# Patient Record
Sex: Female | Born: 1990 | Race: White | Hispanic: No | Marital: Single | Smoking: Never smoker
Health system: Southern US, Community
[De-identification: ages and names within clinical notes are randomized; demographics above are authoritative.]

## PROBLEM LIST (undated history)

## (undated) DIAGNOSIS — L709 Acne, unspecified: Secondary | ICD-10-CM

## (undated) DIAGNOSIS — G43909 Migraine, unspecified, not intractable, without status migrainosus: Secondary | ICD-10-CM

## (undated) DIAGNOSIS — R87629 Unspecified abnormal cytological findings in specimens from vagina: Secondary | ICD-10-CM

---

## 2019-01-01 ENCOUNTER — Ambulatory Visit: Payer: Self-pay | Admitting: Family Medicine

## 2019-01-01 DIAGNOSIS — M79675 Pain in left toe(s): Secondary | ICD-10-CM

## 2019-01-01 NOTE — Patient Instructions (Signed)
1. Will assess for infection and/or gout and provide results to patients PCP for further work up (go to [facility]) 2. Treatment recommended today is to monitor and keep pain scale and diet diary and use Ibuprofen 400 mg  As needed for pain. 3. Wear comfortable shoes when at all possible  Acute Pain, Adult Acute pain is a type of pain that may last for just a few days or as long as six months. It is often related to an illness, injury, or medical procedure. Acute pain may be mild, moderate, or severe. It usually goes away once your injury has healed or you are no longer ill. Pain can make it hard for you to do daily activities. It can cause anxiety and lead to other problems if left untreated. Treatment depends on the cause and severity of your acute pain. Follow these instructions at home:  Check your pain level as told by your health care provider.  Take over-the-counter and prescription medicines only as told by your health care provider.  If you are taking prescription pain medicine: ? Ask your health care provider about taking a stool softener or laxative to prevent constipation. ? Do not stop taking the medicine suddenly. Talk to your health care provider about how and when to discontinue prescription pain medicine. ? If your pain is severe, do not take more pills than instructed by your health care provider. ? Do not take other over-the-counter pain medicines in addition to this medicine unless told by your health care provider. ? Do not drive or operate heavy machinery while taking prescription pain medicine.  Apply ice or heat as told by your health care provider. These may reduce swelling and pain.  Ask your health care provider if other strategies such as distraction, relaxation, or physical therapies can help your pain.  Keep all follow-up visits as told by your health care provider. This is important. Contact a health care provider if:  You have pain that is not controlled by  medicine.  Your pain does not improve or gets worse.  You have side effects from pain medicines, such as vomitingor confusion. Get help right away if:  You have severe pain.  You have trouble breathing.  You lose consciousness.  You have chest pain or pressure that lasts for more than a few minutes. Along with the chest pain you may: ? Have pain or discomfort in one or both arms, your back, neck, jaw, or stomach. ? Have shortness of breath. ? Break out in a cold sweat. ? Feel nauseous. ? Become light-headed. These symptoms may represent a serious problem that is an emergency. Do not wait to see if the symptoms will go away. Get medical help right away. Call your local emergency services (911 in the [place] Do not drive yourself to the hospital. This information is not intended to replace advice given to you by your health care provider. Make sure you discuss any questions you have with your health care provider. Document Released: 11/12/2015 Document Revised: 04/05/2016 Document Reviewed: 11/12/2015 Elsevier Interactive Patient Education  2019 [employer].

## 2019-01-01 NOTE — Progress Notes (Signed)
Michelle Church is a 28 y.o. female who presents today with 1 days of left great toe pain. She has taken ibuprofen 400 mg at 5 am this morning- she does report that this provided her with relief.. She denies any history of trauma but does report a recent pedicure 3 weeks ago. She reports she had 3-4 drinks of alcohol last night and having pork and mashed potatoes for dinner. She is unsure if her father and grandfather has gout but believes they may have. 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    Review of Systems  Constitutional: Negative for chills, fever and malaise/fatigue.  HENT: Negative for congestion, ear discharge, ear pain, sinus pain and sore throat.   Eyes: Negative.   Respiratory: Negative for cough, sputum production and shortness of breath.   Cardiovascular: Negative.  Negative for chest pain.  Gastrointestinal: Negative for abdominal pain, diarrhea, nausea and vomiting.  Genitourinary: Negative for dysuria, frequency, hematuria and urgency.  Musculoskeletal: Negative for myalgias.       Right great toe pain x 1 day  Skin: Negative.   Neurological: Negative for headaches.  Endo/Heme/Allergies: Negative.   Psychiatric/Behavioral: Negative.     Doucett has a current medication list which includes the following prescription(s): amphetamine-dextroamphetamine, cranberry, fexofenadine hcl, galcanezumab-gnlm, and azo complete feminine balance. Also has no allergies on file.  Gussler  has no past medical history on file. Also  has no past surgical history on file.    O: Vitals:   01/01/19 1028  BP: 100/65  Pulse: 92  Resp: 16  Temp: 98.1 F (36.7 C)  SpO2: 100%     Physical Exam Vitals signs reviewed.  Constitutional:      Appearance: She is well-developed. She is not toxic-appearing.  HENT:     Head: Normocephalic.     Right Ear: Hearing, tympanic membrane, ear canal and external ear normal.     Left Ear: Hearing, tympanic  membrane, ear canal and external ear normal.     Nose: Nose normal.     Mouth/Throat:     Pharynx: Uvula midline.  Neck:     Musculoskeletal: Normal range of motion and neck supple.  Cardiovascular:     Rate and Rhythm: Normal rate and regular rhythm.     Pulses: Normal pulses.     Heart sounds: Normal heart sounds.  Pulmonary:     Effort: Pulmonary effort is normal.     Breath sounds: Normal breath sounds.  Abdominal:     General: Bowel sounds are normal.     Palpations: Abdomen is soft.  Musculoskeletal: Normal range of motion.       Feet:  Feet:     Right foot:     Skin integrity: Skin integrity normal.     Left foot:     Skin integrity: Skin integrity normal.     Comments: Mild erythema noted to left great toe- no other evidence of deformity, lesion, nail injury or trauma, warmth. Full ROM- skin intact- no joint hypertrophy to crepitus noted. No evidence of edema, effusion. Pulses (pedal) +2-WNL- toe is cold- but capillary refill intact- (patient states toes are generally cold) Lymphadenopathy:     Head:     Right side of head: No submental or submandibular adenopathy.     Left side of head: No submental or submandibular adenopathy.     Cervical: No cervical adenopathy.  Neurological:     Mental Status: She is alert  and oriented to person, place, and time.    A: 1. Pain of right great toe    P: 1. Pain of right great toe Differential includes trauma, fracture, infection and gout. Will draw labs due to the timing of pain, and inability to get into PCP at this time, and ask that patient follow up with the PCP for comprehensive eval and diagnosis (snow). Discussed with patient that no diagnosis related to the pain could be made with the unremarkable exam and limited diagnostic capabilities onsite, reports of negative hx of trauma, and normal PE. Will order labs and provide to patient so that PCP follow up can be initiated. - Uric acid - White blood count and  differential  Other orders - amphetamine-dextroamphetamine (ADDERALL) 30 MG tablet; Take 30 mg by mouth daily. - Lactobacillus (AZO COMPLETE FEMININE BALANCE) CAPS; Take by mouth. - Cranberry 1000 MG CAPS; Take by mouth. - Fexofenadine HCl (ALLEGRA ALLERGY PO); Take by mouth. - Galcanezumab-gnlm (EMGALITY [place]); Inject into the skin.   Discussed with patient exam findings, suspected diagnosis etiology and  reviewed recommended treatment plan and follow up, including complications and indications for urgent medical follow up and evaluation. Medications including use and indications reviewed with patient. Patient provided relevant patient education on diagnosis and/or relevant related condition that were discussed and reviewed with patient at discharge. Patient verbalized understanding of information provided and agrees with plan of care (POC), all questions answered.

## 2019-01-04 ENCOUNTER — Telehealth: Payer: Self-pay

## 2019-01-04 NOTE — Telephone Encounter (Signed)
Patient was not available.

## 2019-01-04 NOTE — Telephone Encounter (Signed)
I spoke with the patient and let her know the provider wants her to go to the lab as the provider is waiting for the results, the patient agreed and she will go to the lab today.

## 2019-01-04 NOTE — Telephone Encounter (Signed)
Patient called Michelle Church back, she feels better,she wants know if she still needs to go to the Lab as she went on Saturday and was closed.

## 2019-01-19 ENCOUNTER — Ambulatory Visit (INDEPENDENT_AMBULATORY_CARE_PROVIDER_SITE_OTHER): Payer: Self-pay

## 2019-01-19 DIAGNOSIS — Z0000 Encounter for general adult medical examination without abnormal findings: Secondary | ICD-10-CM

## 2019-04-16 ENCOUNTER — Other Ambulatory Visit: Payer: Self-pay | Admitting: Gastroenterology

## 2019-04-16 DIAGNOSIS — R1013 Epigastric pain: Secondary | ICD-10-CM

## 2019-04-16 DIAGNOSIS — R112 Nausea with vomiting, unspecified: Secondary | ICD-10-CM

## 2019-04-29 ENCOUNTER — Ambulatory Visit
Admission: RE | Admit: 2019-04-29 | Discharge: 2019-04-29 | Disposition: A | Discharge status: Home / Self Care | Payer: Self-pay | Source: Ambulatory Visit | Attending: Gastroenterology | Admitting: Gastroenterology

## 2019-04-29 DIAGNOSIS — R112 Nausea with vomiting, unspecified: Secondary | ICD-10-CM

## 2019-04-29 DIAGNOSIS — R1013 Epigastric pain: Secondary | ICD-10-CM

## 2019-05-03 ENCOUNTER — Ambulatory Visit: Payer: Self-pay | Admitting: Internal Medicine

## 2019-08-08 ENCOUNTER — Other Ambulatory Visit: Payer: Self-pay

## 2019-08-08 ENCOUNTER — Encounter (HOSPITAL_COMMUNITY): Payer: Self-pay | Admitting: Emergency Medicine

## 2019-08-08 ENCOUNTER — Ambulatory Visit (HOSPITAL_COMMUNITY)
Admission: EM | Admit: 2019-08-08 | Discharge: 2019-08-08 | Disposition: A | Discharge status: Home / Self Care | Payer: BLUE CROSS/BLUE SHIELD | Attending: Emergency Medicine | Admitting: Emergency Medicine

## 2019-08-08 DIAGNOSIS — M545 Low back pain, unspecified: Secondary | ICD-10-CM

## 2019-08-08 DIAGNOSIS — Z3202 Encounter for pregnancy test, result negative: Secondary | ICD-10-CM

## 2019-08-08 DIAGNOSIS — R10819 Abdominal tenderness, unspecified site: Secondary | ICD-10-CM | POA: Insufficient documentation

## 2019-08-08 HISTORY — DX: Acne, unspecified: L70.9

## 2019-08-08 HISTORY — DX: Unspecified abnormal cytological findings in specimens from vagina: R87.629

## 2019-08-08 HISTORY — DX: Migraine, unspecified, not intractable, without status migrainosus: G43.909

## 2019-08-08 LAB — POCT PREGNANCY, URINE: Preg Test, Ur: NEGATIVE

## 2019-08-08 LAB — POCT URINALYSIS DIP (DEVICE)
Bilirubin Urine: NEGATIVE
Glucose, UA: NEGATIVE mg/dL
Hgb urine dipstick: NEGATIVE
Ketones, ur: NEGATIVE mg/dL
Leukocytes,Ua: NEGATIVE
Nitrite: NEGATIVE
Protein, ur: NEGATIVE mg/dL
Specific Gravity, Urine: 1.01 (ref 1.005–1.030)
Urobilinogen, UA: 0.2 mg/dL (ref 0.0–1.0)
pH: 6.5 (ref 5.0–8.0)

## 2019-08-08 MED ORDER — ACETAMINOPHEN 325 MG PO TABS
ORAL_TABLET | ORAL | Status: AC
  Filled 2019-08-08: qty 3

## 2019-08-08 MED ORDER — TIZANIDINE HCL 4 MG PO TABS: 4.0000 mg | ORAL_TABLET | Freq: Three times a day (TID) | ORAL | 0 refills | Status: AC | PRN

## 2019-08-08 MED ORDER — PHENAZOPYRIDINE HCL 200 MG PO TABS: 200.0000 mg | ORAL_TABLET | Freq: Three times a day (TID) | ORAL | 0 refills | Status: AC | PRN

## 2019-08-08 MED ORDER — ACETAMINOPHEN 325 MG PO TABS
975.0000 mg | ORAL_TABLET | Freq: Once | ORAL | Status: AC
  Administered 2019-08-08: 13:00:00 975 mg via ORAL

## 2019-08-08 MED ORDER — IBUPROFEN 600 MG PO TABS: 600.0000 mg | ORAL_TABLET | Freq: Four times a day (QID) | ORAL | 0 refills | Status: AC | PRN

## 2019-08-08 NOTE — ED Provider Notes (Signed)
HPI  SUBJECTIVE:  Michelle Church is a 28 y.o. female who presents with severe right-sided low back pain starting this morning.  States that it woke her up.  She describes it as stabbing, throbbing, constant.  She states that it has now spread to her whole lower back.  She reports nonradiating, nonmigratory midline suprapubic fullness, pressure.  No other abdominal pain, flank pain.  She did a lot of heavy lifting yesterday.  No fevers, nausea, vomiting, dysuria, urgency, frequency, cloudy or odorous urine, hematuria.  No vaginal odor, discharge, itching, rash, vaginal bleeding.  She is in a long-term monogamous relationship with her boyfriend who is asymptomatic.  STDs are not a concern today.  She denies saddle anesthesia, bilateral radicular pain, leg weakness, distal numbness or tingling, urinary or fecal incontinence, urinary retention.  She tried 400 mg ibuprofen with improvement in her symptoms.  She has also tried Pepto-Bismol.  Symptoms are also better with keeping her back straight, no aggravating factors.  She had a normal bowel movement 1 to 2 days ago.  States that the car ride over here was not painful.  Took ibuprofen within 4 to 6 hours of evaluation.  She has a history of frequent UTIs and is followed by urology at [facility].  No history of pyelonephritis, nephrolithiasis.  She has a history of GERD, BV.  No history of gonorrhea, chlamydia, HIV, HSV, trichomonas, yeast infections, PID.  No history of cancer, multiple myeloma, diabetes, hypertension, aortic abdominal aneurysm.  Family history negative for nephrolithiasis.  LMP: About a month ago.  She has an IUD and menses are irregular.  She denies the possibility of being pregnant.  PMD: [facility].    Past Medical History:  Diagnosis Date  . Abnormal Pap smear of vagina   . Acne   . Migraines     History reviewed. No pertinent surgical history.  Family History  Problem Relation Age of Onset  . Diabetes Father   .  Heart attack Father     Social History   Tobacco Use  . Smoking status: Never Smoker  . Smokeless tobacco: Never Used  Substance Use Topics  . Alcohol use: Yes  . Drug use: Never    No current facility-administered medications for this encounter.   Current Outpatient Medications:  .  amphetamine-dextroamphetamine (ADDERALL) 30 MG tablet, Take 30 mg by mouth daily., Disp: , Rfl:  .  Fexofenadine HCl (ALLEGRA ALLERGY PO), Take by mouth., Disp: , Rfl:  .  Galcanezumab-gnlm (EMGALITY [place]), Inject into the skin., Disp: , Rfl:  .  spironolactone (ALDACTONE) 100 MG tablet, , Disp: , Rfl:  .  Cranberry 1000 MG CAPS, Take by mouth., Disp: , Rfl:  .  ibuprofen (ADVIL) 600 MG tablet, Take 1 tablet (600 mg total) by mouth every 6 (six) hours as needed., Disp: 30 tablet, Rfl: 0 .  Lactobacillus (AZO COMPLETE FEMININE BALANCE) CAPS, Take by mouth., Disp: , Rfl:  .  phenazopyridine (PYRIDIUM) 200 MG tablet, Take 1 tablet (200 mg total) by mouth 3 (three) times daily as needed for pain., Disp: 6 tablet, Rfl: 0 .  tiZANidine (ZANAFLEX) 4 MG tablet, Take 1 tablet (4 mg total) by mouth every 8 (eight) hours as needed for muscle spasms., Disp: 30 tablet, Rfl: 0  No Known Allergies   ROS  As noted in HPI.   Physical Exam  BP 96/62 (BP Location: Left Arm) Comment: Pt states this is normal for her  Pulse 71   Temp 98.5  F (36.9 C) (Oral)   Resp 18   SpO2 95%   Constitutional: Well developed, well nourished, no acute distress Eyes:  EOMI, conjunctiva normal bilaterally HENT: Normocephalic, atraumatic,mucus membranes moist Respiratory: Normal inspiratory effort, lungs clear bilaterally Cardiovascular: Normal rate GI: Flat, soft.  Nondistended.  Positive suprapubic tenderness.  No guarding, rebound.  No flank tenderness.  No right lower quadrant, left lower quadrant tenderness.  Active bowel sounds.   Back: No L-spine, SI joint tenderness.  No paralumbar tenderness.  No tenderness over the QL.   No CVAT.   No muscle spasm. No bony tenderness.  baseline ROM with intact PT pulses, No pain with int/ext rotation flex/extension hips bilaterally. SLR neg bilaterally. Sensation baseline light touch bilaterally for Pt, DTR's symmetric and intact bilaterally KJ, Motor symmetric bilateral 5/5 hip flexion, quadriceps, hamstrings, EHL, foot dorsiflexion, foot plantarflexion, gait normal Skin: No rash, skin intact Musculoskeletal: no deformities Neurologic: Alert & oriented x 3, no focal neuro deficits Psychiatric: Speech and behavior appropriate   ED Course   Medications  acetaminophen (TYLENOL) tablet 975 mg (975 mg Oral Given 08/08/19 1256)  acetaminophen (TYLENOL) 325 MG tablet (has no administration in time range)    Orders Placed This Encounter  Procedures  . Urine culture    Standing Status:   Standing    Number of Occurrences:   1  . POC urine pregnancy    Standing Status:   Standing    Number of Occurrences:   1  . POCT urinalysis dip (device)    Standing Status:   Standing    Number of Occurrences:   1  . Pregnancy, urine POC    Standing Status:   Standing    Number of Occurrences:   1    Results for orders placed or performed during the hospital encounter of 08/08/19 (from the past 24 hour(s))  POCT urinalysis dip (device)     Status: None   Collection Time: 08/08/19 12:45 PM  Result Value Ref Range   Glucose, UA NEGATIVE NEGATIVE mg/dL   Bilirubin Urine NEGATIVE NEGATIVE   Ketones, ur NEGATIVE NEGATIVE mg/dL   Specific Gravity, Urine 1.010 1.005 - 1.030   Hgb urine dipstick NEGATIVE NEGATIVE   pH 6.5 5.0 - 8.0   Protein, ur NEGATIVE NEGATIVE mg/dL   Urobilinogen, UA 0.2 0.0 - 1.0 mg/dL   Nitrite NEGATIVE NEGATIVE   Leukocytes,Ua NEGATIVE NEGATIVE  Pregnancy, urine POC     Status: None   Collection Time: 08/08/19 12:50 PM  Result Value Ref Range   Preg Test, Ur NEGATIVE NEGATIVE   No results found.  ED Clinical Impression  1. Acute bilateral low back pain  without sciatica   2. Suprapubic tenderness      ED Assessment/Plan  Suspect UTI versus early pyelonephritis versus nephrolithiasis.  Will check a upreg, urine.  She has no musculoskeletal tenderness.  Patient took 400 mg of ibuprofen about an hour before evaluation, will give 975 mg of Tylenol.  Reevaluation, patient states that she feels better.  Urine pregnancy negative.  Urine negative for UTI, hematuria.  Send urine off for culture because she is suprapubic tenderness and history of frequent UTIs.   Unsure as to the cause of the patient's symptoms.  Considered doing L-spine because she mentioned a 15 pound weight loss and decreased appetite over the past several months when I went back in, but she has no bony tenderness, no midline pain, no history of cancer, this is low in the differential.  PID in the differential, but she has no risk factors and has no vaginal complaints.  Considered nephrolithiasis, but it is bilateral, she has no hematuria, personal history of nephrolithiasis, family history of nephrolithiasis.  Will send home with ibuprofen 600 mg combined with 1 g of Tylenol, Pyridium, Zanaflex.  She does have cause for to be musculoskeletal given she did a lot of heavy lifting yesterday.  If she is not better in 24 to 48 hours, she will follow-up with her primary care physician.  Gave her very strict ER return precautions.  Discussed labs,MDM, treatment plan, and plan for follow-up with patient. Discussed sn/sx that should prompt return to the ED. patient agrees with plan.   Meds ordered this encounter  Medications  . acetaminophen (TYLENOL) tablet 975 mg  . phenazopyridine (PYRIDIUM) 200 MG tablet    Sig: Take 1 tablet (200 mg total) by mouth 3 (three) times daily as needed for pain.    Dispense:  6 tablet    Refill:  0  . ibuprofen (ADVIL) 600 MG tablet    Sig: Take 1 tablet (600 mg total) by mouth every 6 (six) hours as needed.    Dispense:  30 tablet    Refill:  0  .  tiZANidine (ZANAFLEX) 4 MG tablet    Sig: Take 1 tablet (4 mg total) by mouth every 8 (eight) hours as needed for muscle spasms.    Dispense:  30 tablet    Refill:  0    *This clinic note was created using [employer]. Therefore, there may be occasional mistakes despite careful proofreading.   ?    Melynda Ripple, MD 08/09/19 [phone]

## 2019-08-08 NOTE — Discharge Instructions (Addendum)
Take 600 mg of ibuprofen combined with 1 g of Tylenol 3 or 4 times a day as needed for pain.  Try the Zanaflex, which is a muscle relaxant.  Go immediately to the ER if you get worse or for the other signs and symptoms that we discussed

## 2019-08-08 NOTE — ED Triage Notes (Addendum)
Pt reports being woken up from sleep with right lower back pain that she states is around her kidney.  She also reported pressure in her lower abdomen.  Pt states she took 32 ml of pepto bismol at 0600 and 400mg  Ibuprofen at 0600 and 1100.  Pt states the abdominal pain has subsided but the pain in her back is along her entire lower back now.  She states she can get no relief.  Pt admits to having several issues in the past few months, with abdominal pain, recent endoscopy, hormonal issues, etc.

## 2019-08-09 LAB — URINE CULTURE: Culture: NO GROWTH

## 2019-09-09 ENCOUNTER — Other Ambulatory Visit: Payer: Self-pay | Admitting: Gastroenterology

## 2019-09-09 DIAGNOSIS — R110 Nausea: Secondary | ICD-10-CM

## 2020-10-11 IMAGING — US ULTRASOUND ABDOMEN LIMITED
1 series · 14 of 25 positions shown · non-contrast
Comparison: None.

CLINICAL DATA: 27-year-old female with abdominal pain, nausea and
vomiting.

EXAM:
ULTRASOUND ABDOMEN LIMITED RIGHT UPPER QUADRANT

[Series 1: ultrasound abdomen limited · 0.12mm/px · 14 of 53 slices shown]
[im 1/53]
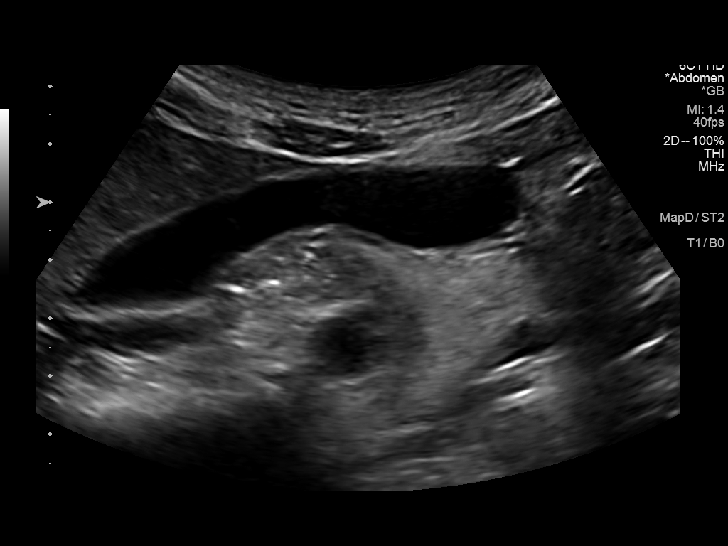
[im 5/53]
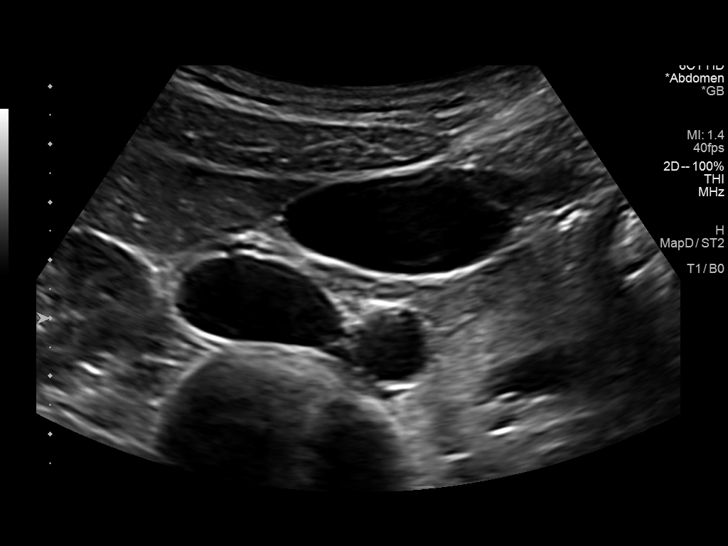
[im 9/53]
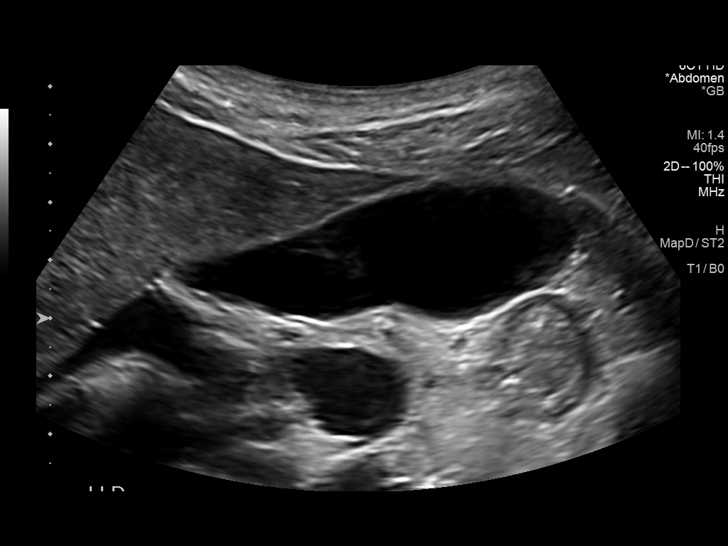
[im 14/53]
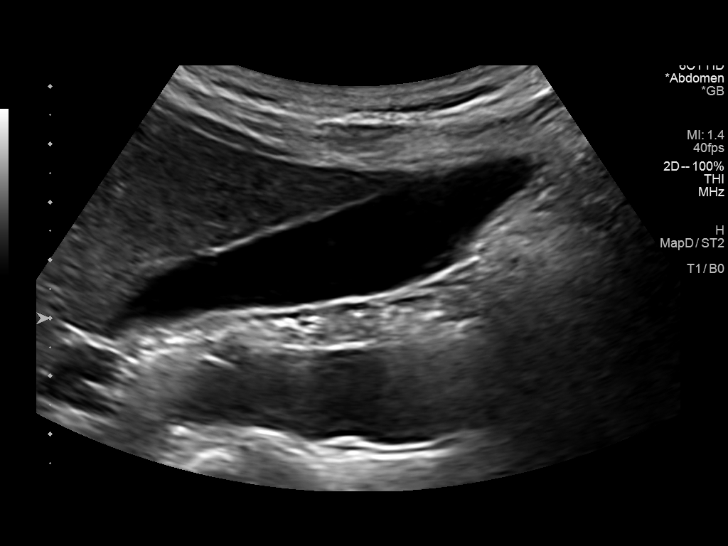
[im 18/53]
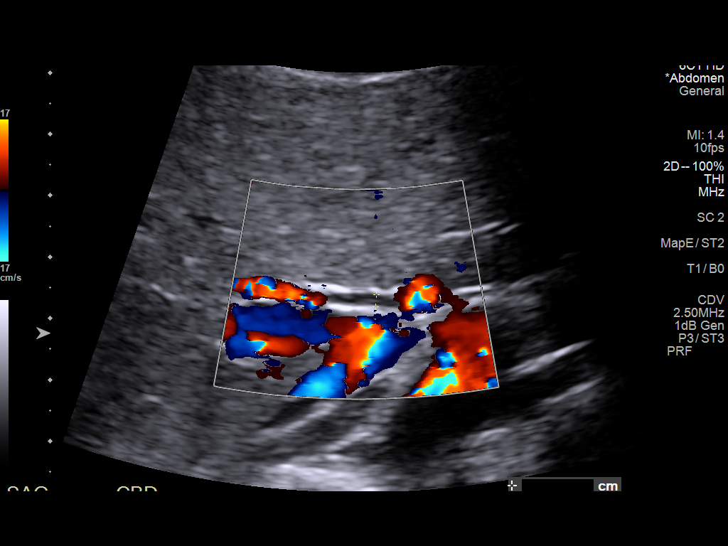
[im 20/53]
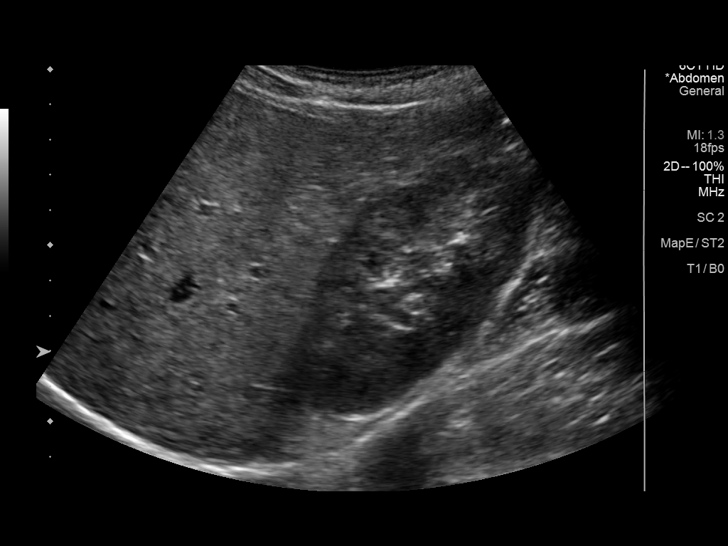
[im 24/53]
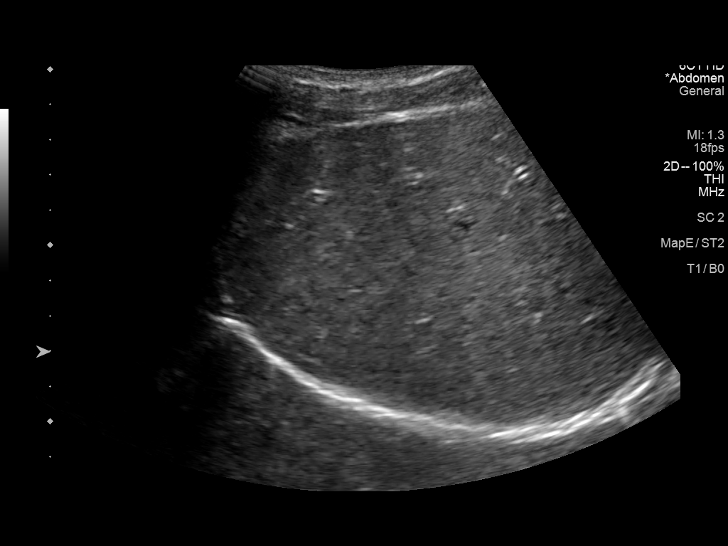
[im 29/53]
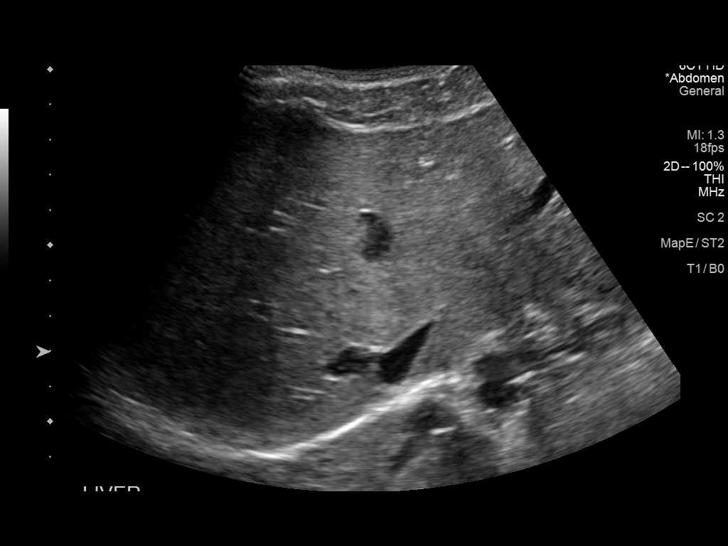
[im 33/53]
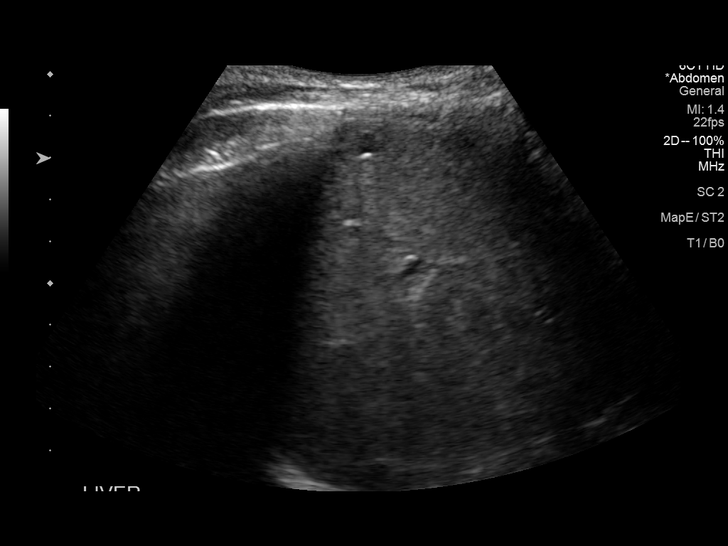
[im 35/53]
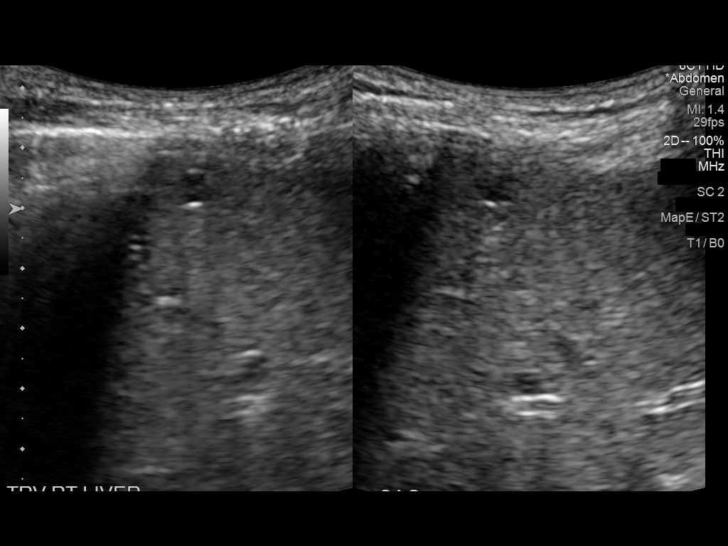
[im 40/53]
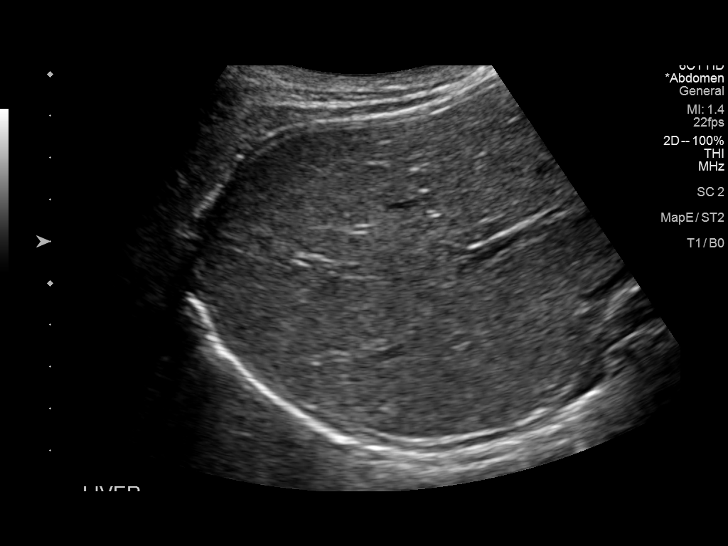
[im 44/53]
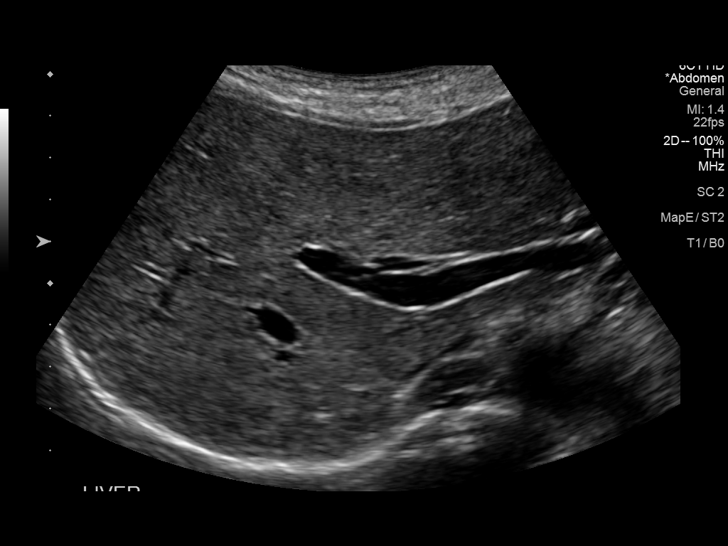
[im 48/53]
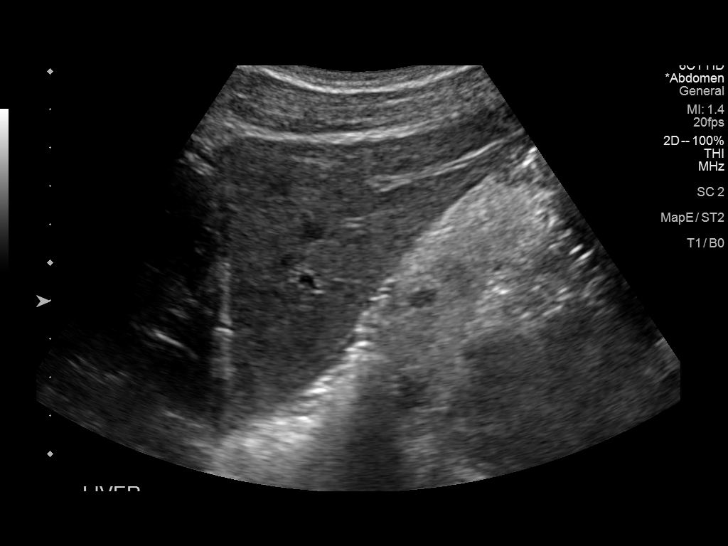
[im 53/53]
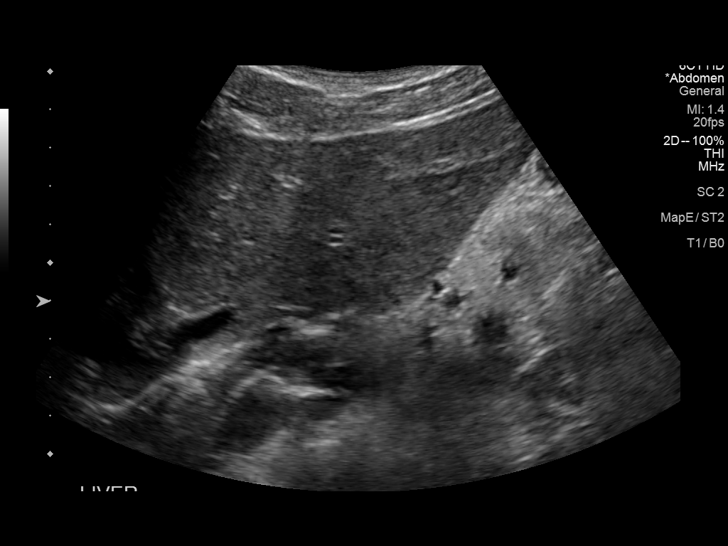

[14 of 25 positions shown; findings below may reference images not displayed]

FINDINGS: Gallbladder:

The gallbladder is unremarkable. There is no evidence of
cholelithiasis or acute cholecystitis.

Common bile duct:

Diameter: 2 mm.  No intrahepatic or extrahepatic biliary dilatation.

Liver:

No significant hepatic abnormalities are noted. Echogenicity is
normal. Portal vein is patent on color Doppler imaging with normal
direction of blood flow towards the liver.
IMPRESSION: No significant abnormalities.  Unremarkable gallbladder.

## 2023-03-13 ENCOUNTER — Ambulatory Visit: Payer: No Typology Code available for payment source | Admitting: Physical Therapy

## 2023-03-18 ENCOUNTER — Ambulatory Visit: Payer: BC Managed Care – PPO | Attending: Obstetrics and Gynecology | Admitting: Physical Therapy

## 2023-03-18 ENCOUNTER — Other Ambulatory Visit: Payer: Self-pay

## 2023-03-18 ENCOUNTER — Encounter: Payer: Self-pay | Admitting: Physical Therapy

## 2023-03-18 DIAGNOSIS — R279 Unspecified lack of coordination: Secondary | ICD-10-CM | POA: Diagnosis present

## 2023-03-18 DIAGNOSIS — M6281 Muscle weakness (generalized): Secondary | ICD-10-CM | POA: Diagnosis present

## 2023-03-18 NOTE — Therapy (Signed)
OUTPATIENT PHYSICAL THERAPY FEMALE PELVIC EVALUATION   Patient Name: Michelle Church MRN: [id number] DOB:[birth year], 32 y.o., female Today's Date: 03/18/2023  END OF SESSION:  PT End of Session - 03/18/23 1449     Visit Number 1    Date for PT Re-Evaluation 06/10/23    Authorization Type BCBS    PT Start Time 1449    PT Stop Time 1530    PT Time Calculation (min) 41 min    Activity Tolerance Patient tolerated treatment well    Behavior During Therapy WFL for tasks assessed/performed             Past Medical History:  Diagnosis Date   Abnormal Pap smear of vagina    Acne    Migraines    History reviewed. No pertinent surgical history. There are no problems to display for this patient.   PCP: Jettie Pagan, NP   REFERRING PROVIDER: Jory Sims, MD   REFERRING DIAG: R32 (ICD-10-CM) - Unspecified urinary incontinence   THERAPY DIAG:  Muscle weakness (generalized)  Unspecified lack of coordination  Rationale for Evaluation and Treatment: Rehabilitation  ONSET DATE: fall of 2022  SUBJECTIVE:                                                                                                                                                                                           SUBJECTIVE STATEMENT: During pregnancy started having leakage.  I have pumped for a year after that.  Now baby is 16 months and having more time to work on leakage.  Leakage can happen with exercise, coughing, just water running, drinking caffeine. Fluid intake: Yes: coffee/tea am, water at least 8-12 glasses    PAIN:  Are you having pain? No   PRECAUTIONS: None  WEIGHT BEARING RESTRICTIONS: No  FALLS:  Has patient fallen in last 6 months? No  LIVING ENVIRONMENT: Lives with: lives with their daughter Lives in: House/apartment   OCCUPATION: work from home part time  PLOF: Independent  PATIENT GOALS: stop having leakage  PERTINENT HISTORY:  Recovering from post  concussion (2020) Sexual abuse: No  BOWEL MOVEMENT: Pain with bowel movement: No No issues  URINATION: Pain with urination: No Fully empty bladder: Yes:   Stream: Strong Urgency: No Frequency: more frequent, no nocturia Leakage: Coughing, Sneezing, Laughing, Exercise, and running water Pads: Yes: 2/day  INTERCOURSE: Pain with intercourse:  no  PREGNANCY: Vaginal deliveries 1 Tearing Yes: 2nd deg   PROLAPSE: None   OBJECTIVE:    COGNITION: Overall cognitive status: Within functional limits for tasks assessed   MUSCLE LENGTH: Hamstrings: normal appearance with toe touch  LUMBAR  SPECIAL TESTS:  Single leg stand left trendelenburg  FUNCTIONAL TESTS:  ASLR compression improves  GAIT:  Comments: WFL   POSTURE: increased lumbar lordosis and anterior pelvic tilt  PELVIC ALIGNMENT:  LUMBARAROM/PROM:  A/PROM A/PROM  eval  Flexion   Extension   Right lateral flexion   Left lateral flexion   Right rotation   Left rotation    (Blank rows = not tested)  LOWER EXTREMITY ROM:  Passive ROM Right eval Left eval  Hip flexion    Hip extension    Hip abduction    Hip adduction    Hip internal rotation    Hip external rotation    Knee flexion    Knee extension    Ankle dorsiflexion    Ankle plantarflexion    Ankle inversion    Ankle eversion     (Blank rows = not tested)  LOWER EXTREMITY MMT:  MMT Right eval Left eval  Hip flexion    Hip extension    Hip abduction    Hip adduction    Hip internal rotation    Hip external rotation    Knee flexion    Knee extension    Ankle dorsiflexion    Ankle plantarflexion    Ankle inversion    Ankle eversion     PALPATION:   General  - back tight                External Perineal Exam palpation only outside of clothing - bulging when attempting to kegel                             Internal Pelvic Floor deferred due to baby and child present  Patient confirms identification and approves PT to  assess internal pelvic floor and treatment Yes deferred   PELVIC MMT: deferred   MMT eval  Vaginal   Internal Anal Sphincter   External Anal Sphincter   Puborectalis   Diastasis Recti   (Blank rows = not tested)        TONE: NA  PROLAPSE:   TODAY'S TREATMENT:                                                                                                                              DATE: 03/18/23  EVAL and initial HEP Access Code: [email or web address] breathing with transversus abdominus activation   PATIENT EDUCATION:  Education details: Access Code: [email or web address] Person educated: Patient Education method: Explanation, Demonstration, Tactile cues, Verbal cues, and Handouts Education comprehension: verbalized understanding  HOME EXERCISE PROGRAM: Access Code: [email or web address] URL: [email or web address] Date: 03/18/2023 Prepared by: Dwana Curd  Exercises - Supine Transversus Abdominis Bracing - Hands on Thighs  - 1 x daily - 7 x weekly - 1 sets - 10 reps - 5 sec hold  ASSESSMENT:  CLINICAL IMPRESSION: Patient is a 32 y.o. female who was seen today for physical therapy evaluation and treatment  for mixed incontinence. Pt has core weakness based on ASLR test and left hip weak in SLS.  Pt has some difficulty with full ribcage ROM and difficulty stabilizing ribcage and overusing back muscles on exertion.  Uses diaphragm to stabilize along with back.  Pt will benefit from skilled PT to address all above mentioned impairments and return to maximum function without leakage.  OBJECTIVE IMPAIRMENTS: decreased coordination, difficulty walking, decreased strength, impaired tone, and postural dysfunction.   ACTIVITY LIMITATIONS: lifting, bending, continence, toileting, and caring for others  PARTICIPATION LIMITATIONS: cleaning and community activity  PERSONAL FACTORS: 1-2 comorbidities: vaginal delivery, 2nd deg tear  are also affecting patient's functional outcome.   REHAB  POTENTIAL: Excellent  CLINICAL DECISION MAKING: Evolving/moderate complexity  EVALUATION COMPLEXITY: Moderate   GOALS: Goals reviewed with patient? Yes  SHORT TERM GOALS: Target date: 04/15/23  Ind with initial transversus abdominus activation Baseline: Goal status: INITIAL  2.  Ind with how to exhale on exertion Baseline:  Goal status: INITIAL  3.  Report 20% less leakage throughout the day Baseline:  Goal status: INITIAL    LONG TERM GOALS: Target date: 06/10/23  Pt will be independent with advanced HEP to maintain improvements made throughout therapy  Baseline:  Goal status: INITIAL  2.  Pt will be able to functional actions such as walk to the bathroom or turn on water without leakage  Baseline:  Goal status: INITIAL  3.  Pt will be able to lift at least 10 lb correctly for 10 reps without pain or leakage for functional activities  Baseline:  Goal status: INITIAL  4.  Pt will be able to run/workout or walk for 60 or more minutes without leakage or discomfort  Baseline:  Goal status: INITIAL   PLAN:  PT FREQUENCY: 1-2x/week  PT DURATION: 12 weeks  PLANNED INTERVENTIONS: Therapeutic exercises, Therapeutic activity, Neuromuscular re-education, Balance training, Gait training, Patient/Family education, Self Care, Joint mobilization, Dry Needling, Electrical stimulation, Cryotherapy, Moist heat, Taping, Biofeedback, Manual therapy, and Re-evaluation  PLAN FOR NEXT SESSION: internal assessment and treat and educate on correctly doing kegel, back stretches and ribcage depression and stability   Brayton Caves Calvina Liptak, PT 03/18/2023, 5:36 PM

## 2023-03-27 ENCOUNTER — Ambulatory Visit: Payer: BC Managed Care – PPO | Admitting: Physical Therapy

## 2023-04-04 ENCOUNTER — Ambulatory Visit: Payer: BC Managed Care – PPO | Admitting: Physical Therapy

## 2023-04-04 DIAGNOSIS — R279 Unspecified lack of coordination: Secondary | ICD-10-CM

## 2023-04-04 DIAGNOSIS — M6281 Muscle weakness (generalized): Secondary | ICD-10-CM | POA: Diagnosis not present

## 2023-04-04 NOTE — Therapy (Signed)
OUTPATIENT PHYSICAL THERAPY FEMALE PELVIC TREATMENT   Patient Name: Michelle Church MRN: [id number] DOB:[birth year], 32 y.o., female Today's Date: 04/05/2023  END OF SESSION:  PT End of Session - 04/04/23 1109     Visit Number 2    Date for PT Re-Evaluation 06/10/23    Authorization Type BCBS - Medicaid secondary HE[employer] BLUE    PT Start Time 1103    PT Stop Time 1150    PT Time Calculation (min) 47 min    Activity Tolerance Patient tolerated treatment well    Behavior During Therapy WFL for tasks assessed/performed              Past Medical History:  Diagnosis Date   Abnormal Pap smear of vagina    Acne    Migraines    No past surgical history on file. There are no problems to display for this patient.   PCP: Jettie Pagan, NP   REFERRING PROVIDER: Jory Sims, MD   REFERRING DIAG: R32 (ICD-10-CM) - Unspecified urinary incontinence   THERAPY DIAG:  Muscle weakness (generalized)  Unspecified lack of coordination  Rationale for Evaluation and Treatment: Rehabilitation  ONSET DATE: fall of 2022  SUBJECTIVE:                                                                                                                                                                                           SUBJECTIVE STATEMENT: Pt is able to have internal assessment today since children were not present Fluid intake: Yes: coffee/tea am, water at least 8-12 glasses    PAIN:  Are you having pain? No   PRECAUTIONS: None  WEIGHT BEARING RESTRICTIONS: No  FALLS:  Has patient fallen in last 6 months? No  LIVING ENVIRONMENT: Lives with: lives with their daughter Lives in: House/apartment   OCCUPATION: work from home part time  PLOF: Independent  PATIENT GOALS: stop having leakage  PERTINENT HISTORY:  Recovering from post concussion (2020) Sexual abuse: No  BOWEL MOVEMENT: Pain with bowel movement: No No issues  URINATION: Pain with  urination: No Fully empty bladder: Yes:   Stream: Strong Urgency: No Frequency: more frequent, no nocturia Leakage: Coughing, Sneezing, Laughing, Exercise, and running water Pads: Yes: 2/day  INTERCOURSE: Pain with intercourse:  no  PREGNANCY: Vaginal deliveries 1 Tearing Yes: 2nd deg   PROLAPSE: None   OBJECTIVE:    COGNITION: Overall cognitive status: Within functional limits for tasks assessed   MUSCLE LENGTH: Hamstrings: normal appearance with toe touch  LUMBAR SPECIAL TESTS:  Single leg stand left trendelenburg  FUNCTIONAL TESTS:  ASLR compression improves  GAIT:  Comments: [facility]  POSTURE: increased lumbar lordosis and anterior pelvic tilt  PELVIC ALIGNMENT:  LUMBARAROM/PROM:  A/PROM A/PROM  eval  Flexion   Extension   Right lateral flexion   Left lateral flexion   Right rotation   Left rotation    (Blank rows = not tested)  LOWER EXTREMITY ROM:  Passive ROM Right eval Left eval  Hip flexion    Hip extension    Hip abduction    Hip adduction    Hip internal rotation    Hip external rotation    Knee flexion    Knee extension    Ankle dorsiflexion    Ankle plantarflexion    Ankle inversion    Ankle eversion     (Blank rows = not tested)  LOWER EXTREMITY MMT:  MMT Right eval Left eval  Hip flexion    Hip extension    Hip abduction    Hip adduction    Hip internal rotation    Hip external rotation    Knee flexion    Knee extension    Ankle dorsiflexion    Ankle plantarflexion    Ankle inversion    Ankle eversion     PALPATION:   General  - back tight                External Perineal Exam palpation only outside of clothing - bulging when attempting to kegel                             Internal Pelvic Floor deferred due to baby and child present  Patient confirms identification and approves PT to assess internal pelvic floor and treatment Yes deferred   PELVIC MMT: deferred   MMT 04/04/23  Vaginal 2/5 with difficulty  relaxing and twitching with attempt to hold, 3 quick flicks slow to relax  Internal Anal Sphincter   External Anal Sphincter   Puborectalis   Diastasis Recti   (Blank rows = not tested)        TONE: NA  PROLAPSE:   TODAY'S TREATMENT:                                                                                                                              DATE: 04/04/23  NMRE: Transversus abdominus activation with exhale SLR Hooklying Pressing on thighs Exercises: Breathing and bulging Rock pose Child pose with side bend Cat cow   PATIENT EDUCATION:  Education details: Access Code: [email or web address] Person educated: Patient Education method: Explanation, Demonstration, Actor cues, Verbal cues, and Handouts Education comprehension: verbalized understanding  HOME EXERCISE PROGRAM: Access Code: [email or web address] URL: [email or web address] Date: 04/04/2023 Prepared by: Dwana Curd  Exercises - Supine Transversus Abdominis Bracing - Hands on Thighs  - 1 x daily - 7 x weekly - 1 sets - 10 reps - 5 sec hold - Small Range Straight Leg Raise  - 1 x daily - 7 x weekly -  3 sets - 10 reps - Cat Cow  - 1 x daily - 7 x weekly - 3 sets - 10 reps - Rock  - 1 x daily - 7 x weekly - 3 sets - 10 reps  ASSESSMENT:  CLINICAL IMPRESSION: Today's session included internal pelvic assessment as was not able to do at evaluation.  Pt tolerated well.  Noted that pt has tight pelvic floor with difficulty relaxing.  Pt has little movement of pelvic floor upwards causing diminished signals to bladder detrusor muscles.  Pt exercises for home were reviewed and given to pt today.  Focus is places on stretching and relaxing as well as trained in how to engage deep core muscles today.  Pt will benefit from skilled PT to address all above mentioned impairments and return to maximum function without leakage.  OBJECTIVE IMPAIRMENTS: decreased coordination, difficulty walking, decreased strength,  impaired tone, and postural dysfunction.   ACTIVITY LIMITATIONS: lifting, bending, continence, toileting, and caring for others  PARTICIPATION LIMITATIONS: cleaning and community activity  PERSONAL FACTORS: 1-2 comorbidities: vaginal delivery, 2nd deg tear  are also affecting patient's functional outcome.   REHAB POTENTIAL: Excellent  CLINICAL DECISION MAKING: Evolving/moderate complexity  EVALUATION COMPLEXITY: Moderate   GOALS: Goals reviewed with patient? Yes  SHORT TERM GOALS: Target date: 04/15/23  Ind with initial transversus abdominus activation Baseline: Goal status: IN PROGRESS  2.  Ind with how to exhale on exertion Baseline:  Goal status: IN PROGRESS  3.  Report 20% less leakage throughout the day Baseline:  Goal status: IN PROGRESS    LONG TERM GOALS: Target date: 06/10/23  Pt will be independent with advanced HEP to maintain improvements made throughout therapy  Baseline:  Goal status: INITIAL  2.  Pt will be able to functional actions such as walk to the bathroom or turn on water without leakage  Baseline:  Goal status: INITIAL  3.  Pt will be able to lift at least 10 lb correctly for 10 reps without pain or leakage for functional activities  Baseline:  Goal status: INITIAL  4.  Pt will be able to run/workout or walk for 60 or more minutes without leakage or discomfort  Baseline:  Goal status: INITIAL   PLAN:  PT FREQUENCY: 1-2x/week  PT DURATION: 12 weeks  PLANNED INTERVENTIONS: Therapeutic exercises, Therapeutic activity, Neuromuscular re-education, Balance training, Gait training, Patient/Family education, Self Care, Joint mobilization, Dry Needling, Electrical stimulation, Cryotherapy, Moist heat, Taping, Biofeedback, Manual therapy, and Re-evaluation  PLAN FOR NEXT SESSION: possibly use RUSI for pelvic floor and transversus abdominus activation, progression with transversus abdominus and pelvic floor in various positions, gluteal  andlumbar STM dry needling?   Brayton Caves Bettejane Leavens, PT 04/05/2023, 2:28 PM

## 2023-04-11 ENCOUNTER — Encounter: Payer: Self-pay | Admitting: Physical Therapy

## 2023-04-24 ENCOUNTER — Ambulatory Visit: Payer: BC Managed Care – PPO | Attending: Obstetrics and Gynecology | Admitting: Physical Therapy

## 2023-04-24 DIAGNOSIS — M6281 Muscle weakness (generalized): Secondary | ICD-10-CM | POA: Diagnosis present

## 2023-04-24 DIAGNOSIS — R279 Unspecified lack of coordination: Secondary | ICD-10-CM | POA: Diagnosis present

## 2023-04-24 NOTE — Therapy (Signed)
OUTPATIENT PHYSICAL THERAPY FEMALE PELVIC TREATMENT   Patient Name: Michelle Church MRN: [id number] DOB:[birth year], 32 y.o., female Today's Date: 04/24/2023  END OF SESSION:  PT End of Session - 04/24/23 1453     Visit Number 3    Date for PT Re-Evaluation 06/10/23    Authorization Type BCBS - Medicaid secondary [employer] BLUE    PT Start Time 1451    PT Stop Time 1529    PT Time Calculation (min) 38 min    Activity Tolerance Patient tolerated treatment well    Behavior During Therapy WFL for tasks assessed/performed               Past Medical History:  Diagnosis Date   Abnormal Pap smear of vagina    Acne    Migraines    No past surgical history on file. There are no problems to display for this patient.   PCP: Jettie Pagan, NP   REFERRING PROVIDER: Jory Sims, MD   REFERRING DIAG: R32 (ICD-10-CM) - Unspecified urinary incontinence   THERAPY DIAG:  Muscle weakness (generalized)  Unspecified lack of coordination  Rationale for Evaluation and Treatment: Rehabilitation  ONSET DATE: fall of 2022  SUBJECTIVE:                                                                                                                                                                                           SUBJECTIVE STATEMENT: Pt has been busy and not able to do the exercises recently.  Coffee and tea makes it very bad.   Fluid intake: Yes: coffee/tea am, water at least 8-12 glasses    PAIN:  Are you having pain? No   PRECAUTIONS: None  WEIGHT BEARING RESTRICTIONS: No  FALLS:  Has patient fallen in last 6 months? No  LIVING ENVIRONMENT: Lives with: lives with their daughter Lives in: House/apartment   OCCUPATION: work from home part time  PLOF: Independent  PATIENT GOALS: stop having leakage  PERTINENT HISTORY:  Recovering from post concussion (2020) Sexual abuse: No  BOWEL MOVEMENT: Pain with bowel movement: No No  issues  URINATION: Pain with urination: No Fully empty bladder: Yes:   Stream: Strong Urgency: No Frequency: more frequent, no nocturia Leakage: Coughing, Sneezing, Laughing, Exercise, and running water Pads: Yes: 2/day  INTERCOURSE: Pain with intercourse:  no  PREGNANCY: Vaginal deliveries 1 Tearing Yes: 2nd deg   PROLAPSE: None   OBJECTIVE:    COGNITION: Overall cognitive status: Within functional limits for tasks assessed   MUSCLE LENGTH: Hamstrings: normal appearance with toe touch  LUMBAR SPECIAL TESTS:  Single leg stand left trendelenburg  FUNCTIONAL TESTS:  ASLR compression improves  GAIT:  Comments: WFL   POSTURE: increased lumbar lordosis and anterior pelvic tilt  PELVIC ALIGNMENT:  LUMBARAROM/PROM:  A/PROM A/PROM  eval  Flexion   Extension   Right lateral flexion   Left lateral flexion   Right rotation   Left rotation    (Blank rows = not tested)  LOWER EXTREMITY ROM:  Passive ROM Right eval Left eval  Hip flexion    Hip extension    Hip abduction    Hip adduction    Hip internal rotation    Hip external rotation    Knee flexion    Knee extension    Ankle dorsiflexion    Ankle plantarflexion    Ankle inversion    Ankle eversion     (Blank rows = not tested)  LOWER EXTREMITY MMT:  MMT Right eval Left eval  Hip flexion    Hip extension    Hip abduction    Hip adduction    Hip internal rotation    Hip external rotation    Knee flexion    Knee extension    Ankle dorsiflexion    Ankle plantarflexion    Ankle inversion    Ankle eversion     PALPATION:   General  - back tight                External Perineal Exam palpation only outside of clothing - bulging when attempting to kegel                             Internal Pelvic Floor deferred due to baby and child present  Patient confirms identification and approves PT to assess internal pelvic floor and treatment Yes deferred   PELVIC MMT: deferred   MMT  04/04/23  Vaginal 2/5 with difficulty relaxing and twitching with attempt to hold, 3 quick flicks slow to relax  Internal Anal Sphincter   External Anal Sphincter   Puborectalis   Diastasis Recti   (Blank rows = not tested)        TONE: NA  PROLAPSE:   TODAY'S TREATMENT:                                                                                                                              DATE: 04/24/23  RUSI - pelvic floor - looking at bladder lift and kegel with exhale and abdominal wall transversus abdominus and internal oblique activation in supine with focuse on engaging transversus abdominus without excess IO Theract: urgency techniques  PATIENT EDUCATION:  Education details: Access Code: [email or web address] Person educated: Patient Education method: Explanation, Demonstration, Tactile cues, Verbal cues, and Handouts Education comprehension: verbalized understanding  HOME EXERCISE PROGRAM: Access Code: [email or web address] URL: [email or web address] Date: 04/04/2023 Prepared by: Dwana Curd  Exercises - Supine Transversus Abdominis Bracing - Hands on Thighs  - 1 x daily - 7 x weekly - 1 sets - 10  reps - 5 sec hold - Small Range Straight Leg Raise  - 1 x daily - 7 x weekly - 3 sets - 10 reps - Cat Cow  - 1 x daily - 7 x weekly - 3 sets - 10 reps - Rock  - 1 x daily - 7 x weekly - 3 sets - 10 reps  ASSESSMENT:  CLINICAL IMPRESSION: Today's session focused on coordination and contracting pelvic floor and abdominal contraction.  Pt demonstrates pushing down with kegel but does well with transversus abdominus activation and exhale.  Pt will benefit from skilled PT to continue to address coordination and core strength. Given urge techniques today.  OBJECTIVE IMPAIRMENTS: decreased coordination, difficulty walking, decreased strength, impaired tone, and postural dysfunction.   ACTIVITY LIMITATIONS: lifting, bending, continence, toileting, and caring for  others  PARTICIPATION LIMITATIONS: cleaning and community activity  PERSONAL FACTORS: 1-2 comorbidities: vaginal delivery, 2nd deg tear  are also affecting patient's functional outcome.   REHAB POTENTIAL: Excellent  CLINICAL DECISION MAKING: Evolving/moderate complexity  EVALUATION COMPLEXITY: Moderate   GOALS: Goals reviewed with patient? Yes  SHORT TERM GOALS: Target date: 04/15/23  Ind with initial transversus abdominus activation Baseline: Goal status: MET  2.  Ind with how to exhale on exertion Baseline:  Goal status: MET  3.  Report 20% less leakage throughout the day Baseline:  Goal status: IN PROGRESS    LONG TERM GOALS: Target date: 06/10/23  Pt will be independent with advanced HEP to maintain improvements made throughout therapy  Baseline:  Goal status: IN PROGRESS  2.  Pt will be able to functional actions such as walk to the bathroom or turn on water without leakage  Baseline:  Goal status: IN PROGRESS  3.  Pt will be able to lift at least 10 lb correctly for 10 reps without pain or leakage for functional activities  Baseline:  Goal status: IN PROGRESS  4.  Pt will be able to run/workout or walk for 60 or more minutes without leakage or discomfort  Baseline:  Goal status: IN PROGRESS   PLAN:  PT FREQUENCY: 1-2x/week  PT DURATION: 12 weeks  PLANNED INTERVENTIONS: Therapeutic exercises, Therapeutic activity, Neuromuscular re-education, Balance training, Gait training, Patient/Family education, Self Care, Joint mobilization, Dry Needling, Electrical stimulation, Cryotherapy, Moist heat, Taping, Biofeedback, Manual therapy, and Re-evaluation  PLAN FOR NEXT SESSION: RE-EVAL f/u on urge techniques and doing HEP; progression with transversus abdominus and pelvic floor in various positions, gluteal andlumbar STM dry needling?   Brayton Caves Tawney Vanorman, PT 04/24/2023, 2:53 PM

## 2023-04-29 ENCOUNTER — Ambulatory Visit: Payer: BC Managed Care – PPO | Admitting: Physical Therapy

## 2023-04-29 ENCOUNTER — Encounter: Payer: Self-pay | Admitting: Physical Therapy

## 2023-04-29 DIAGNOSIS — R279 Unspecified lack of coordination: Secondary | ICD-10-CM

## 2023-04-29 DIAGNOSIS — M6281 Muscle weakness (generalized): Secondary | ICD-10-CM | POA: Diagnosis not present

## 2023-04-29 NOTE — Therapy (Signed)
OUTPATIENT PHYSICAL THERAPY FEMALE PELVIC TREATMENT   Patient Name: Michelle Church MRN: [id number] DOB:[birth year], 32 y.o., female Today's Date: 04/29/2023  END OF SESSION:  PT End of Session - 04/29/23 1138     Visit Number 4    Date for PT Re-Evaluation 06/10/23    Authorization Type BCBS - Medicaid secondary [employer] BLUE    PT Start Time 1106    PT Stop Time 1141    PT Time Calculation (min) 35 min    Activity Tolerance Patient tolerated treatment well    Behavior During Therapy WFL for tasks assessed/performed                Past Medical History:  Diagnosis Date   Abnormal Pap smear of vagina    Acne    Migraines    History reviewed. No pertinent surgical history. There are no problems to display for this patient.   PCP: Jettie Pagan, NP   REFERRING PROVIDER: Jory Sims, MD   REFERRING DIAG: R32 (ICD-10-CM) - Unspecified urinary incontinence   THERAPY DIAG:  Muscle weakness (generalized)  Unspecified lack of coordination  Rationale for Evaluation and Treatment: Rehabilitation  ONSET DATE: fall of 2022  SUBJECTIVE:                                                                                                                                                                                           SUBJECTIVE STATEMENT: Pt has been busy and not able to do the exercises recently.  Coffee and tea makes it very bad.   Fluid intake: Yes: coffee/tea am, water at least 8-12 glasses    PAIN:  Are you having pain? No   PRECAUTIONS: None  WEIGHT BEARING RESTRICTIONS: No  FALLS:  Has patient fallen in last 6 months? No  LIVING ENVIRONMENT: Lives with: lives with their daughter Lives in: House/apartment   OCCUPATION: work from home part time  PLOF: Independent  PATIENT GOALS: stop having leakage  PERTINENT HISTORY:  Recovering from post concussion (2020) Sexual abuse: No  BOWEL MOVEMENT: Pain with bowel movement: No No  issues  URINATION: Pain with urination: No Fully empty bladder: Yes:   Stream: Strong Urgency: No Frequency: more frequent, no nocturia Leakage: Coughing, Sneezing, Laughing, Exercise, and running water Pads: Yes: 2/day  INTERCOURSE: Pain with intercourse:  no  PREGNANCY: Vaginal deliveries 1 Tearing Yes: 2nd deg   PROLAPSE: None   OBJECTIVE:    COGNITION: Overall cognitive status: Within functional limits for tasks assessed   MUSCLE LENGTH: Hamstrings: normal appearance with toe touch  LUMBAR SPECIAL TESTS:  Single leg stand left trendelenburg  FUNCTIONAL  TESTS:  ASLR compression improves  GAIT:  Comments: WFL   POSTURE: increased lumbar lordosis and anterior pelvic tilt  PELVIC ALIGNMENT:  LUMBARAROM/PROM:  A/PROM A/PROM  eval  Flexion   Extension   Right lateral flexion   Left lateral flexion   Right rotation   Left rotation    (Blank rows = not tested)  LOWER EXTREMITY ROM:  Passive ROM Right eval Left eval  Hip flexion    Hip extension    Hip abduction    Hip adduction    Hip internal rotation    Hip external rotation    Knee flexion    Knee extension    Ankle dorsiflexion    Ankle plantarflexion    Ankle inversion    Ankle eversion     (Blank rows = not tested)  LOWER EXTREMITY MMT:  MMT Right eval Left eval  Hip flexion    Hip extension    Hip abduction    Hip adduction    Hip internal rotation    Hip external rotation    Knee flexion    Knee extension    Ankle dorsiflexion    Ankle plantarflexion    Ankle inversion    Ankle eversion     PALPATION:   General  - back tight                External Perineal Exam palpation only outside of clothing - bulging when attempting to kegel                             Internal Pelvic Floor deferred due to baby and child present  Patient confirms identification and approves PT to assess internal pelvic floor and treatment Yes deferred   PELVIC MMT: deferred   MMT  04/04/23  Vaginal 2/5 with difficulty relaxing and twitching with attempt to hold, 3 quick flicks slow to relax  Internal Anal Sphincter   External Anal Sphincter   Puborectalis   Diastasis Recti   (Blank rows = not tested)        TONE: high (04/29/23)   PROLAPSE:   TODAY'S TREATMENT:                                                                                                                              DATE: 04/29/23  Manual:  STM internal and breathing and bulging Theract: self massage using coconut oil  PATIENT EDUCATION:  Education details: Access Code: [email or web address] Person educated: Patient Education method: Explanation, Demonstration, Tactile cues, Verbal cues, and Handouts Education comprehension: verbalized understanding  HOME EXERCISE PROGRAM: Access Code: [email or web address] URL: [email or web address] Date: 04/04/2023 Prepared by: Dwana Curd  Exercises - Supine Transversus Abdominis Bracing - Hands on Thighs  - 1 x daily - 7 x weekly - 1 sets - 10 reps - 5 sec hold - Small Range Straight Leg Raise  - 1 x daily - 7  x weekly - 3 sets - 10 reps - Cat Cow  - 1 x daily - 7 x weekly - 3 sets - 10 reps - Rock  - 1 x daily - 7 x weekly - 3 sets - 10 reps  ASSESSMENT:  CLINICAL IMPRESSION: Today's session focused on re-assessment of pelvic floor.  Pt was much tighter and not severe tenderness but was uncomfortable.  STM released things somewhat and she was able to get improved contraction after doing STM for a little while.  Pt given techniques for self stretching pelvic floor.  OBJECTIVE IMPAIRMENTS: decreased coordination, difficulty walking, decreased strength, impaired tone, and postural dysfunction.   ACTIVITY LIMITATIONS: lifting, bending, continence, toileting, and caring for others  PARTICIPATION LIMITATIONS: cleaning and community activity  PERSONAL FACTORS: 1-2 comorbidities: vaginal delivery, 2nd deg tear  are also affecting patient's functional  outcome.   REHAB POTENTIAL: Excellent  CLINICAL DECISION MAKING: Evolving/moderate complexity  EVALUATION COMPLEXITY: Moderate   GOALS: Goals reviewed with patient? Yes  SHORT TERM GOALS: Target date: 04/15/23  Ind with initial transversus abdominus activation Baseline: Goal status: MET  2.  Ind with how to exhale on exertion Baseline:  Goal status: MET  3.  Report 20% less leakage throughout the day Baseline:  Goal status: IN PROGRESS    LONG TERM GOALS: Target date: 06/10/23 Updated 04/29/23  Pt will be independent with advanced HEP to maintain improvements made throughout therapy  Baseline:  Goal status: IN PROGRESS  2.  Pt will be able to functional actions such as walk to the bathroom or turn on water without leakage  Baseline:  Goal status: IN PROGRESS  3.  Pt will be able to lift at least 10 lb correctly for 10 reps without pain or leakage for functional activities  Baseline:  Goal status: IN PROGRESS  4.  Pt will be able to run/workout or walk for 60 or more minutes without leakage or discomfort  Baseline:  Goal status: IN PROGRESS   PLAN:  PT FREQUENCY: 1-2x/week  PT DURATION: 12 weeks  PLANNED INTERVENTIONS: Therapeutic exercises, Therapeutic activity, Neuromuscular re-education, Balance training, Gait training, Patient/Family education, Self Care, Joint mobilization, Dry Needling, Electrical stimulation, Cryotherapy, Moist heat, Taping, Biofeedback, Manual therapy, and Re-evaluation  PLAN FOR NEXT SESSION: f/u on stretch and relax pelvic floor, breathing and bulging and internal STM, side bend and fascial release lumbar and thoracic mobility   Jakki L Nikayla Madaris, PT 04/29/2023, 11:50 AM

## 2023-05-06 ENCOUNTER — Encounter: Payer: Self-pay | Admitting: Physical Therapy

## 2023-05-06 ENCOUNTER — Ambulatory Visit: Payer: BC Managed Care – PPO | Admitting: Physical Therapy

## 2023-05-06 DIAGNOSIS — M6281 Muscle weakness (generalized): Secondary | ICD-10-CM | POA: Diagnosis not present

## 2023-05-06 DIAGNOSIS — R279 Unspecified lack of coordination: Secondary | ICD-10-CM

## 2023-05-06 NOTE — Therapy (Signed)
OUTPATIENT PHYSICAL THERAPY FEMALE PELVIC TREATMENT   Patient Name: Michelle Church MRN: [id number] DOB:Jul 03, 1991, 32 y.o., female Today's Date: 05/06/2023  END OF SESSION:  PT End of Session - 05/06/23 1518     Visit Number 5    Date for PT Re-Evaluation 06/10/23    Authorization Type BCBS - Medicaid secondary [employer] BLUE    PT Start Time 1402    PT Stop Time 1447    PT Time Calculation (min) 45 min    Activity Tolerance Patient tolerated treatment well    Behavior During Therapy WFL for tasks assessed/performed                 Past Medical History:  Diagnosis Date   Abnormal Pap smear of vagina    Acne    Migraines    History reviewed. No pertinent surgical history. There are no problems to display for this patient.   PCP: Jettie Pagan, NP   REFERRING PROVIDER: Jory Sims, MD   REFERRING DIAG: R32 (ICD-10-CM) - Unspecified urinary incontinence   THERAPY DIAG:  Muscle weakness (generalized)  Unspecified lack of coordination  Rationale for Evaluation and Treatment: Rehabilitation  ONSET DATE: fall of 2022  SUBJECTIVE:                                                                                                                                                                                           SUBJECTIVE STATEMENT: Pt has been busy and not able to do the exercises recently.  Coffee and tea makes it very bad.   Fluid intake: Yes: coffee/tea am, water at least 8-12 glasses    PAIN:  Are you having pain? No   PRECAUTIONS: None  WEIGHT BEARING RESTRICTIONS: No  FALLS:  Has patient fallen in last 6 months? No  LIVING ENVIRONMENT: Lives with: lives with their daughter Lives in: House/apartment   OCCUPATION: work from home part time  PLOF: Independent  PATIENT GOALS: stop having leakage  PERTINENT HISTORY:  Recovering from post concussion (2020) Sexual abuse: No  BOWEL MOVEMENT: Pain with bowel movement: No No  issues  URINATION: Pain with urination: No Fully empty bladder: Yes:   Stream: Strong Urgency: No Frequency: more frequent, no nocturia Leakage: Coughing, Sneezing, Laughing, Exercise, and running water Pads: Yes: 2/day  INTERCOURSE: Pain with intercourse:  no  PREGNANCY: Vaginal deliveries 1 Tearing Yes: 2nd deg   PROLAPSE: None   OBJECTIVE:    COGNITION: Overall cognitive status: Within functional limits for tasks assessed   MUSCLE LENGTH: Hamstrings: normal appearance with toe touch  LUMBAR SPECIAL TESTS:  Single leg stand left trendelenburg  FUNCTIONAL TESTS:  ASLR compression improves  GAIT:  Comments: WFL   POSTURE: increased lumbar lordosis and anterior pelvic tilt  PELVIC ALIGNMENT:  LUMBARAROM/PROM:  A/PROM A/PROM  eval  Flexion   Extension   Right lateral flexion   Left lateral flexion   Right rotation   Left rotation    (Blank rows = not tested)  LOWER EXTREMITY ROM:  Passive ROM Right eval Left eval  Hip flexion    Hip extension    Hip abduction    Hip adduction    Hip internal rotation    Hip external rotation    Knee flexion    Knee extension    Ankle dorsiflexion    Ankle plantarflexion    Ankle inversion    Ankle eversion     (Blank rows = not tested)  LOWER EXTREMITY MMT:  MMT Right eval Left eval  Hip flexion    Hip extension    Hip abduction    Hip adduction    Hip internal rotation    Hip external rotation    Knee flexion    Knee extension    Ankle dorsiflexion    Ankle plantarflexion    Ankle inversion    Ankle eversion     PALPATION:   General  - back tight                External Perineal Exam palpation only outside of clothing - bulging when attempting to kegel                             Internal Pelvic Floor deferred due to baby and child present  Patient confirms identification and approves PT to assess internal pelvic floor and treatment Yes deferred   PELVIC MMT: deferred   MMT  04/04/23  Vaginal 2/5 with difficulty relaxing and twitching with attempt to hold, 3 quick flicks slow to relax  Internal Anal Sphincter   External Anal Sphincter   Puborectalis   Diastasis Recti   (Blank rows = not tested)        TONE: high (04/29/23)   PROLAPSE:   TODAY'S TREATMENT:                                                                                                                              DATE: 05/06/23  Manual:  STM internal to left>Rt ileococcygeus; breathing and bulging; exhale with kegel; transversus abdominus activation and assessed pelvic floor to make sure not bulging Patient confirms identification and approves physical therapist to perform internal soft tissue work    NMRE: working on posture and transversus abdominus activation with exhale during the following movements Wall arm raises Shoulder ext green band Walk outs green band LE machine - hip ext, hip flexion - 40lb 20x each  Discussed kegel weights or using tampon applicator for feedback   PATIENT EDUCATION:  Education details: Access Code: [email or web address]  Person educated: Patient Education method: Explanation, Demonstration, Tactile cues, Verbal cues, and Handouts Education comprehension: verbalized understanding  HOME EXERCISE PROGRAM: Access Code: [email or web address] URL: [email or web address] Date: 05/06/2023 Prepared by: Dwana Curd  Exercises - Supine Transversus Abdominis Bracing - Hands on Thighs  - 1 x daily - 7 x weekly - 1 sets - 10 reps - 5 sec hold - Small Range Straight Leg Raise  - 1 x daily - 7 x weekly - 3 sets - 10 reps - Cat Cow  - 1 x daily - 7 x weekly - 3 sets - 10 reps - Rock  - 1 x daily - 7 x weekly - 3 sets - 10 reps - Hooklying Small March  - 1 x daily - 7 x weekly - 2 sets - 10 reps  ASSESSMENT:  CLINICAL IMPRESSION: Pt was much less tight throughout the pelvic floor today.  Pt continue to bulge a little when doing a kegel.  Pt does correctly with  weak squeeze sometimes but is inconsistent.  Today's session focused on core strengthening with coordination with breathing for pressure management since she still lacks control of the pelvic floor. Progression of core strength is needed for complete recovery.  OBJECTIVE IMPAIRMENTS: decreased coordination, difficulty walking, decreased strength, impaired tone, and postural dysfunction.   ACTIVITY LIMITATIONS: lifting, bending, continence, toileting, and caring for others  PARTICIPATION LIMITATIONS: cleaning and community activity  PERSONAL FACTORS: 1-2 comorbidities: vaginal delivery, 2nd deg tear  are also affecting patient's functional outcome.   REHAB POTENTIAL: Excellent  CLINICAL DECISION MAKING: Evolving/moderate complexity  EVALUATION COMPLEXITY: Moderate   GOALS: Goals reviewed with patient? Yes  SHORT TERM GOALS: Target date: 04/15/23  Ind with initial transversus abdominus activation Baseline: Goal status: MET  2.  Ind with how to exhale on exertion Baseline:  Goal status: MET  3.  Report 20% less leakage throughout the day Baseline:  Goal status: NOT MET    LONG TERM GOALS: Target date: 06/10/23 Updated 05/06/23  Pt will be independent with advanced HEP to maintain improvements made throughout therapy  Baseline:  Goal status: IN PROGRESS  2.  Pt will be able to functional actions such as walk to the bathroom or turn on water without leakage  Baseline: was able to suppress urge but then leaked when turning on the water Goal status: IN PROGRESS  3.  Pt will be able to lift at least 10 lb correctly for 10 reps without pain or leakage for functional activities  Baseline:  Goal status: IN PROGRESS  4.  Pt will be able to run/workout or walk for 60 or more minutes without leakage or discomfort  Baseline:  Goal status: IN PROGRESS   PLAN:  PT FREQUENCY: 1-2x/week  PT DURATION: 12 weeks  PLANNED INTERVENTIONS: Therapeutic exercises, Therapeutic  activity, Neuromuscular re-education, Balance training, Gait training, Patient/Family education, Self Care, Joint mobilization, Dry Needling, Electrical stimulation, Cryotherapy, Moist heat, Taping, Biofeedback, Manual therapy, and Re-evaluation  PLAN FOR NEXT SESSION: side bend and fascial release lumbar and thoracic mobility on peanut; core strength with coordination of breathing; f/u on supine march and progress   [employer], PT 05/06/2023, 3:20 PM

## 2023-05-12 NOTE — Therapy (Deleted)
OUTPATIENT PHYSICAL THERAPY FEMALE PELVIC TREATMENT   Patient Name: Michelle Church MRN: [id number] DOB:[birth year], 32 y.o., female Today's Date: 05/12/2023  END OF SESSION:        Past Medical History:  Diagnosis Date   Abnormal Pap smear of vagina    Acne    Migraines    No past surgical history on file. There are no problems to display for this patient.   PCP: Jettie Pagan, NP   REFERRING PROVIDER: Jory Sims, MD   REFERRING DIAG: R32 (ICD-10-CM) - Unspecified urinary incontinence   THERAPY DIAG:  No diagnosis found.  Rationale for Evaluation and Treatment: Rehabilitation  ONSET DATE: fall of 2022  SUBJECTIVE:                                                                                                                                                                                           SUBJECTIVE STATEMENT: Pt has been busy and not able to do the exercises recently.  Coffee and tea makes it very bad.   Fluid intake: Yes: coffee/tea am, water at least 8-12 glasses    PAIN:  Are you having pain? No   PRECAUTIONS: None  WEIGHT BEARING RESTRICTIONS: No  FALLS:  Has patient fallen in last 6 months? No  LIVING ENVIRONMENT: Lives with: lives with their daughter Lives in: House/apartment   OCCUPATION: work from home part time  PLOF: Independent  PATIENT GOALS: stop having leakage  PERTINENT HISTORY:  Recovering from post concussion (2020) Sexual abuse: No  BOWEL MOVEMENT: Pain with bowel movement: No No issues  URINATION: Pain with urination: No Fully empty bladder: Yes:   Stream: Strong Urgency: No Frequency: more frequent, no nocturia Leakage: Coughing, Sneezing, Laughing, Exercise, and running water Pads: Yes: 2/day  INTERCOURSE: Pain with intercourse:  no  PREGNANCY: Vaginal deliveries 1 Tearing Yes: 2nd deg   PROLAPSE: None   OBJECTIVE:    COGNITION: Overall cognitive status: Within functional  limits for tasks assessed   MUSCLE LENGTH: Hamstrings: normal appearance with toe touch  LUMBAR SPECIAL TESTS:  Single leg stand left trendelenburg  FUNCTIONAL TESTS:  ASLR compression improves  GAIT:  Comments: WFL   POSTURE: increased lumbar lordosis and anterior pelvic tilt  PELVIC ALIGNMENT:  LUMBARAROM/PROM:  A/PROM A/PROM  eval  Flexion   Extension   Right lateral flexion   Left lateral flexion   Right rotation   Left rotation    (Blank rows = not tested)  LOWER EXTREMITY ROM:  Passive ROM Right eval Left eval  Hip flexion    Hip extension    Hip abduction    Hip  adduction    Hip internal rotation    Hip external rotation    Knee flexion    Knee extension    Ankle dorsiflexion    Ankle plantarflexion    Ankle inversion    Ankle eversion     (Blank rows = not tested)  LOWER EXTREMITY MMT:  MMT Right eval Left eval  Hip flexion    Hip extension    Hip abduction    Hip adduction    Hip internal rotation    Hip external rotation    Knee flexion    Knee extension    Ankle dorsiflexion    Ankle plantarflexion    Ankle inversion    Ankle eversion     PALPATION:   General  - back tight                External Perineal Exam palpation only outside of clothing - bulging when attempting to kegel                             Internal Pelvic Floor deferred due to baby and child present  Patient confirms identification and approves PT to assess internal pelvic floor and treatment Yes deferred   PELVIC MMT: deferred   MMT 04/04/23  Vaginal 2/5 with difficulty relaxing and twitching with attempt to hold, 3 quick flicks slow to relax  Internal Anal Sphincter   External Anal Sphincter   Puborectalis   Diastasis Recti   (Blank rows = not tested)        TONE: high (04/29/23)   PROLAPSE:   TODAY'S TREATMENT:                                                                                                                              DATE:  05/06/23  Manual:  STM internal to left>Rt ileococcygeus; breathing and bulging; exhale with kegel; transversus abdominus activation and assessed pelvic floor to make sure not bulging Patient confirms identification and approves physical therapist to perform internal soft tissue work    NMRE: working on posture and transversus abdominus activation with exhale during the following movements Wall arm raises Shoulder ext green band Walk outs green band LE machine - hip ext, hip flexion - 40lb 20x each  Discussed kegel weights or using tampon applicator for feedback   PATIENT EDUCATION:  Education details: Access Code: [email or web address] Person educated: Patient Education method: Explanation, Demonstration, Tactile cues, Verbal cues, and Handouts Education comprehension: verbalized understanding  HOME EXERCISE PROGRAM: Access Code: [email or web address] URL: [email or web address] Date: 05/06/2023 Prepared by: Dwana Curd  Exercises - Supine Transversus Abdominis Bracing - Hands on Thighs  - 1 x daily - 7 x weekly - 1 sets - 10 reps - 5 sec hold - Small Range Straight Leg Raise  - 1 x daily - 7 x weekly - 3 sets - 10 reps - Cat Cow  - 1 x  daily - 7 x weekly - 3 sets - 10 reps - Rock  - 1 x daily - 7 x weekly - 3 sets - 10 reps - Hooklying Small March  - 1 x daily - 7 x weekly - 2 sets - 10 reps  ASSESSMENT:  CLINICAL IMPRESSION: Pt was much less tight throughout the pelvic floor today.  Pt continue to bulge a little when doing a kegel.  Pt does correctly with weak squeeze sometimes but is inconsistent.  Today's session focused on core strengthening with coordination with breathing for pressure management since she still lacks control of the pelvic floor. Progression of core strength is needed for complete recovery.  OBJECTIVE IMPAIRMENTS: decreased coordination, difficulty walking, decreased strength, impaired tone, and postural dysfunction.   ACTIVITY LIMITATIONS: lifting, bending,  continence, toileting, and caring for others  PARTICIPATION LIMITATIONS: cleaning and community activity  PERSONAL FACTORS: 1-2 comorbidities: vaginal delivery, 2nd deg tear  are also affecting patient's functional outcome.   REHAB POTENTIAL: Excellent  CLINICAL DECISION MAKING: Evolving/moderate complexity  EVALUATION COMPLEXITY: Moderate   GOALS: Goals reviewed with patient? Yes  SHORT TERM GOALS: Target date: 04/15/23  Ind with initial transversus abdominus activation Baseline: Goal status: MET  2.  Ind with how to exhale on exertion Baseline:  Goal status: MET  3.  Report 20% less leakage throughout the day Baseline:  Goal status: NOT MET    LONG TERM GOALS: Target date: 06/10/23 Updated 05/06/23  Pt will be independent with advanced HEP to maintain improvements made throughout therapy  Baseline:  Goal status: IN PROGRESS  2.  Pt will be able to functional actions such as walk to the bathroom or turn on water without leakage  Baseline: was able to suppress urge but then leaked when turning on the water Goal status: IN PROGRESS  3.  Pt will be able to lift at least 10 lb correctly for 10 reps without pain or leakage for functional activities  Baseline:  Goal status: IN PROGRESS  4.  Pt will be able to run/workout or walk for 60 or more minutes without leakage or discomfort  Baseline:  Goal status: IN PROGRESS   PLAN:  PT FREQUENCY: 1-2x/week  PT DURATION: 12 weeks  PLANNED INTERVENTIONS: Therapeutic exercises, Therapeutic activity, Neuromuscular re-education, Balance training, Gait training, Patient/Family education, Self Care, Joint mobilization, Dry Needling, Electrical stimulation, Cryotherapy, Moist heat, Taping, Biofeedback, Manual therapy, and Re-evaluation  PLAN FOR NEXT SESSION: side bend and fascial release lumbar and thoracic mobility on peanut; core strength with coordination of breathing; f/u on supine march and progress   [employer],  PT 05/12/2023, 12:41 PM

## 2023-05-13 ENCOUNTER — Ambulatory Visit: Payer: BC Managed Care – PPO | Admitting: Physical Therapy

## 2023-05-19 ENCOUNTER — Encounter: Payer: Self-pay | Admitting: Physical Therapy

## 2023-05-21 ENCOUNTER — Encounter: Payer: Self-pay | Admitting: Physical Therapy

## 2023-06-02 NOTE — Therapy (Unsigned)
OUTPATIENT PHYSICAL THERAPY FEMALE PELVIC TREATMENT   Patient Name: Michelle Church MRN: [id number] DOB:[birth year], 32 y.o., female Today's Date: 06/03/2023  END OF SESSION:  PT End of Session - 06/03/23 1233     Visit Number 6    Date for PT Re-Evaluation 06/10/23    Authorization Type BCBS - Medicaid secondary [employer]    PT Start Time 1231    PT Stop Time 1311    PT Time Calculation (min) 40 min    Activity Tolerance Patient tolerated treatment well    Behavior During Therapy WFL for tasks assessed/performed                  Past Medical History:  Diagnosis Date   Abnormal Pap smear of vagina    Acne    Migraines    History reviewed. No pertinent surgical history. There are no problems to display for this patient.   PCP: Jettie Pagan, NP   REFERRING PROVIDER: Jory Sims, MD   REFERRING DIAG: R32 (ICD-10-CM) - Unspecified urinary incontinence   THERAPY DIAG:  Muscle weakness (generalized)  Unspecified lack of coordination  Rationale for Evaluation and Treatment: Rehabilitation  ONSET DATE: fall of 2022  SUBJECTIVE:                                                                                                                                                                                           SUBJECTIVE STATEMENT: Pt is having  leakage without urge lately Fluid intake: Yes: coffee/tea am, water at least 8-12 glasses    PAIN:  Are you having pain? No   PRECAUTIONS: None  WEIGHT BEARING RESTRICTIONS: No  FALLS:  Has patient fallen in last 6 months? No  LIVING ENVIRONMENT: Lives with: lives with their daughter Lives in: House/apartment   OCCUPATION: work from home part time  PLOF: Independent  PATIENT GOALS: stop having leakage  PERTINENT HISTORY:  Recovering from post concussion (2020) Sexual abuse: No  BOWEL MOVEMENT: Pain with bowel movement: No No issues  URINATION: Pain with urination: No Fully  empty bladder: Yes:   Stream: Strong Urgency: No Frequency: more frequent, no nocturia Leakage: Coughing, Sneezing, Laughing, Exercise, and running water Pads: Yes: 2/day  INTERCOURSE: Pain with intercourse:  no  PREGNANCY: Vaginal deliveries 1 Tearing Yes: 2nd deg   PROLAPSE: None   OBJECTIVE:    COGNITION: Overall cognitive status: Within functional limits for tasks assessed   MUSCLE LENGTH: Hamstrings: normal appearance with toe touch  LUMBAR SPECIAL TESTS:  Single leg stand left trendelenburg  FUNCTIONAL TESTS:  ASLR compression improves  GAIT:  Comments: [facility]  POSTURE: increased lumbar lordosis and anterior pelvic tilt  PELVIC ALIGNMENT:  LUMBARAROM/PROM:  A/PROM A/PROM  eval  Flexion   Extension   Right lateral flexion   Left lateral flexion   Right rotation   Left rotation    (Blank rows = not tested)  LOWER EXTREMITY ROM:  Passive ROM Right eval Left eval  Hip flexion    Hip extension    Hip abduction    Hip adduction    Hip internal rotation    Hip external rotation    Knee flexion    Knee extension    Ankle dorsiflexion    Ankle plantarflexion    Ankle inversion    Ankle eversion     (Blank rows = not tested)  LOWER EXTREMITY MMT:  MMT Right eval Left eval  Hip flexion    Hip extension    Hip abduction    Hip adduction    Hip internal rotation    Hip external rotation    Knee flexion    Knee extension    Ankle dorsiflexion    Ankle plantarflexion    Ankle inversion    Ankle eversion     PALPATION:   General  - back tight                External Perineal Exam palpation only outside of clothing - bulging when attempting to kegel                             Internal Pelvic Floor deferred due to baby and child present  Patient confirms identification and approves PT to assess internal pelvic floor and treatment Yes deferred   PELVIC MMT: deferred   MMT 04/04/23  Vaginal 2/5 with difficulty relaxing and  twitching with attempt to hold, 3 quick flicks slow to relax  Internal Anal Sphincter   External Anal Sphincter   Puborectalis   Diastasis Recti   (Blank rows = not tested)        TONE: high (04/29/23)   PROLAPSE:   TODAY'S TREATMENT:                                                                                                                              DATE:  06/03/23    NMRE: working on posture and transversus abdominus activation with exhale during the following movements Quadruped rotation Quadruped ball squeeze and UE reach Side sit thoracic rotation and breathing Standing - rotaiton with band staggered stance and half kneel  Theract: Standing and working on staggered stance knack and quick flicks    05/06/23  Manual:  STM internal to left>Rt ileococcygeus; breathing and bulging; exhale with kegel; transversus abdominus activation and assessed pelvic floor to make sure not bulging Patient confirms identification and approves physical therapist to perform internal soft tissue work    NMRE: working on posture and transversus abdominus activation with exhale during  the following movements Wall arm raises Shoulder ext green band Walk outs green band LE machine - hip ext, hip flexion - 40lb 20x each  Discussed kegel weights or using tampon applicator for feedback   PATIENT EDUCATION:  Education details: Access Code: [email or web address] Person educated: Patient Education method: Explanation, Demonstration, Tactile cues, Verbal cues, and Handouts Education comprehension: verbalized understanding  HOME EXERCISE PROGRAM: Access Code: [email or web address] URL: [email or web address] Date: 05/06/2023 Prepared by: Dwana Curd  Exercises - Supine Transversus Abdominis Bracing - Hands on Thighs  - 1 x daily - 7 x weekly - 1 sets - 10 reps - 5 sec hold - Small Range Straight Leg Raise  - 1 x daily - 7 x weekly - 3 sets - 10 reps - Cat Cow  - 1 x daily - 7 x weekly - 3  sets - 10 reps - Rock  - 1 x daily - 7 x weekly - 3 sets - 10 reps - Hooklying Small March  - 1 x daily - 7 x weekly - 2 sets - 10 reps  ASSESSMENT:  CLINICAL IMPRESSION: Pt working on core strength and thoracic mobility today.  Pt also given ways to get better pelvic floor contraction in standing when feeling the urge come on suddenly.  Today's session focused on core strengthening with coordination with breathing for pressure management. Progression of core strength is needed for complete recovery.  OBJECTIVE IMPAIRMENTS: decreased coordination, difficulty walking, decreased strength, impaired tone, and postural dysfunction.   ACTIVITY LIMITATIONS: lifting, bending, continence, toileting, and caring for others  PARTICIPATION LIMITATIONS: cleaning and community activity  PERSONAL FACTORS: 1-2 comorbidities: vaginal delivery, 2nd deg tear  are also affecting patient's functional outcome.   REHAB POTENTIAL: Excellent  CLINICAL DECISION MAKING: Evolving/moderate complexity  EVALUATION COMPLEXITY: Moderate   GOALS: Goals reviewed with patient? Yes  SHORT TERM GOALS: Target date: 04/15/23  Ind with initial transversus abdominus activation Baseline: Goal status: MET  2.  Ind with how to exhale on exertion Baseline:  Goal status: MET  3.  Report 20% less leakage throughout the day Baseline:  Goal status: NOT MET    LONG TERM GOALS: Target date: 06/10/23 Updated 06/03/23  Pt will be independent with advanced HEP to maintain improvements made throughout therapy  Baseline:  Goal status: IN PROGRESS  2.  Pt will be able to functional actions such as walk to the bathroom or turn on water without leakage  Baseline: was able to suppress urge but then leaked when turning on the water Goal status: IN PROGRESS  3.  Pt will be able to lift at least 10 lb correctly for 10 reps without pain or leakage for functional activities  Baseline:  Goal status: IN PROGRESS  4.  Pt will be  able to run/workout or walk for 60 or more minutes without leakage or discomfort  Baseline:  Goal status: IN PROGRESS   PLAN:  PT FREQUENCY: 1-2x/week  PT DURATION: 12 weeks  PLANNED INTERVENTIONS: Therapeutic exercises, Therapeutic activity, Neuromuscular re-education, Balance training, Gait training, Patient/Family education, Self Care, Joint mobilization, Dry Needling, Electrical stimulation, Cryotherapy, Moist heat, Taping, Biofeedback, Manual therapy, and Re-evaluation  PLAN FOR NEXT SESSION: side bend and fascial release lumbar and thoracic mobility on peanut; core strength with coordination of breathing; f/u on new exercises in HEP   [employer], PT 06/03/2023, 12:34 PM

## 2023-06-03 ENCOUNTER — Ambulatory Visit: Payer: BC Managed Care – PPO | Attending: Obstetrics and Gynecology | Admitting: Physical Therapy

## 2023-06-03 ENCOUNTER — Encounter: Payer: Self-pay | Admitting: Physical Therapy

## 2023-06-03 DIAGNOSIS — R279 Unspecified lack of coordination: Secondary | ICD-10-CM | POA: Diagnosis present

## 2023-06-03 DIAGNOSIS — M6281 Muscle weakness (generalized): Secondary | ICD-10-CM | POA: Diagnosis present

## 2023-06-11 ENCOUNTER — Encounter: Payer: No Typology Code available for payment source | Admitting: Physical Therapy

## 2023-06-13 ENCOUNTER — Ambulatory Visit: Payer: BC Managed Care – PPO | Attending: Obstetrics and Gynecology | Admitting: Physical Therapy

## 2023-06-13 DIAGNOSIS — M6281 Muscle weakness (generalized): Secondary | ICD-10-CM | POA: Diagnosis present

## 2023-06-13 DIAGNOSIS — R279 Unspecified lack of coordination: Secondary | ICD-10-CM | POA: Diagnosis present

## 2023-06-13 NOTE — Therapy (Signed)
OUTPATIENT PHYSICAL THERAPY FEMALE PELVIC TREATMENT   Patient Name: Michelle Church MRN: [id number] DOB:Aug 16, 1991, 32 y.o., female Today's Date: 06/13/2023  END OF SESSION:  PT End of Session - 06/13/23 1156     Visit Number 7    Date for PT Re-Evaluation 06/10/23    Authorization Type BCBS (no longer medicaid)    PT Start Time 1103    PT Stop Time 1143    PT Time Calculation (min) 40 min    Activity Tolerance Patient tolerated treatment well    Behavior During Therapy WFL for tasks assessed/performed                   Past Medical History:  Diagnosis Date   Abnormal Pap smear of vagina    Acne    Migraines    No past surgical history on file. There are no problems to display for this patient.   PCP: Jettie Pagan, NP   REFERRING PROVIDER: Jory Sims, MD   REFERRING DIAG: R32 (ICD-10-CM) - Unspecified urinary incontinence   THERAPY DIAG:  Muscle weakness (generalized)  Unspecified lack of coordination  Rationale for Evaluation and Treatment: Rehabilitation  ONSET DATE: fall of 2022  SUBJECTIVE:                                                                                                                                                                                           SUBJECTIVE STATEMENT: Pt is having  leakage about 50% of the time when bladder is full.  Better but still hasn't had time to focus on exercises.  Feels like I can get better with more time now and I can commit to doing the exercises more now. Fluid intake: Yes: coffee/tea am, water at least 8-12 glasses    PAIN:  Are you having pain? No   PRECAUTIONS: None  WEIGHT BEARING RESTRICTIONS: No  FALLS:  Has patient fallen in last 6 months? No  LIVING ENVIRONMENT: Lives with: lives with their daughter Lives in: House/apartment   OCCUPATION: work from home part time  PLOF: Independent  PATIENT GOALS: stop having leakage  PERTINENT HISTORY:  Recovering  from post concussion (2020) Sexual abuse: No  BOWEL MOVEMENT: Pain with bowel movement: No No issues  URINATION: Pain with urination: No Fully empty bladder: Yes:   Stream: Strong Urgency: No Frequency: more frequent, no nocturia Leakage: Coughing, Sneezing, Laughing, Exercise, and running water Pads: Yes: 2/day  INTERCOURSE: Pain with intercourse:  no  PREGNANCY: Vaginal deliveries 1 Tearing Yes: 2nd deg   PROLAPSE: None   OBJECTIVE:    COGNITION: Overall cognitive status: Within functional limits  for tasks assessed   MUSCLE LENGTH: Hamstrings: normal appearance with toe touch  LUMBAR SPECIAL TESTS:  Single leg stand left trendelenburg  FUNCTIONAL TESTS:  ASLR compression improves  GAIT:  Comments: WFL   POSTURE: increased lumbar lordosis and anterior pelvic tilt  PELVIC ALIGNMENT:  LUMBARAROM/PROM:  A/PROM A/PROM  eval  Flexion   Extension   Right lateral flexion   Left lateral flexion   Right rotation   Left rotation    (Blank rows = not tested)  LOWER EXTREMITY ROM:  Passive ROM Right eval Left eval  Hip flexion    Hip extension    Hip abduction    Hip adduction    Hip internal rotation    Hip external rotation    Knee flexion    Knee extension    Ankle dorsiflexion    Ankle plantarflexion    Ankle inversion    Ankle eversion     (Blank rows = not tested)  LOWER EXTREMITY MMT:  06/13/23: 5/5 bil hips PALPATION:   General  - back tight                External Perineal Exam palpation only outside of clothing - bulging when attempting to kegel                             Internal Pelvic Floor deferred due to baby and child present  Patient confirms identification and approves PT to assess internal pelvic floor and treatment Yes deferred   PELVIC MMT:   MMT 04/04/23  Vaginal 2/5 with difficulty relaxing and twitching with attempt to hold, 3 quick flicks slow to relax  Internal Anal Sphincter   External Anal Sphincter    Puborectalis   Diastasis Recti   (Blank rows = not tested)        TONE: high (04/29/23)   Re-assessment 06/13/23:  MMT 5/5 bil hips  TODAY'S TREATMENT:                                                                                                                              DATE:  06/13/23   NMRE:  Needing cues to keep from bearing down and bulging Supine - ball squeeze Ball squeeze with hip internal rotation - cues on abdomen and pelvic floor "lift up" Exercises: thoracic side bend on peanut in side sitting - breathing into ribcage, MMT as mentioned above    06/03/23    NMRE: working on posture and transversus abdominus activation with exhale during the following movements Quadruped rotation Quadruped ball squeeze and UE reach Side sit thoracic rotation and breathing Standing - rotaiton with band staggered stance and half kneel  Theract: Standing and working on staggered stance knack and quick flicks    05/06/23  Manual:  STM internal to left>Rt ileococcygeus; breathing and bulging; exhale with kegel; transversus abdominus activation and assessed pelvic floor to make sure  not bulging Patient confirms identification and approves physical therapist to perform internal soft tissue work    NMRE: working on posture and transversus abdominus activation with exhale during the following movements Wall arm raises Shoulder ext green band Walk outs green band LE machine - hip ext, hip flexion - 40lb 20x each  Discussed kegel weights or using tampon applicator for feedback   PATIENT EDUCATION:  Education details: Access Code: [email or web address] Person educated: Patient Education method: Explanation, Demonstration, Tactile cues, Verbal cues, and Handouts Education comprehension: verbalized understanding  HOME EXERCISE PROGRAM: Access Code: [email or web address] URL: [email or web address] Date: 05/06/2023 Prepared by: Dwana Curd  Exercises - Supine Transversus  Abdominis Bracing - Hands on Thighs  - 1 x daily - 7 x weekly - 1 sets - 10 reps - 5 sec hold - Small Range Straight Leg Raise  - 1 x daily - 7 x weekly - 3 sets - 10 reps - Cat Cow  - 1 x daily - 7 x weekly - 3 sets - 10 reps - Rock  - 1 x daily - 7 x weekly - 3 sets - 10 reps - Hooklying Small March  - 1 x daily - 7 x weekly - 2 sets - 10 reps  ASSESSMENT:  CLINICAL IMPRESSION: Pt was re-assessed and goals updated and added as seen above and below.  Pt was educated on coordination with cues to lift/sink in/suck in through pelvic floor.  Seemed to work well with cue to lift.  Pt was encouraged to work on this daily in order to get more automatic with exhale and lifting pelvic floor as this has been a challenge for a while.  Overall, she is better but still has difficulty controlling the urge and that is when she still has leakage.  Pt is expected to continue to make progress with skilled PT at this time and will benefit from continuing to work on endurance and coordination of core  OBJECTIVE IMPAIRMENTS: decreased coordination, difficulty walking, decreased strength, impaired tone, and postural dysfunction.   ACTIVITY LIMITATIONS: lifting, bending, continence, toileting, and caring for others  PARTICIPATION LIMITATIONS: cleaning and community activity  PERSONAL FACTORS: 1-2 comorbidities: vaginal delivery, 2nd deg tear  are also affecting patient's functional outcome.   REHAB POTENTIAL: Excellent  CLINICAL DECISION MAKING: Evolving/moderate complexity  EVALUATION COMPLEXITY: Moderate   GOALS: Goals reviewed with patient? Yes  SHORT TERM GOALS: Target date: 04/15/23  Ind with initial transversus abdominus activation Baseline: Goal status: MET  2.  Ind with how to exhale on exertion Baseline:  Goal status: MET  3.  Report 20% less leakage throughout the day Baseline:  Goal status: NOT MET    LONG TERM GOALS: Target date: 09/05/2023  Updated 06/13/23  Pt will be independent  with advanced HEP to maintain improvements made throughout therapy  Baseline:  Goal status: IN PROGRESS  2.  Pt will be able to functional actions such as walk to the bathroom or turn on water without leakage  Baseline: can do these things without leakage under normal circumstances Goal status: MET  3.  Pt will be able to lift at least 10 lb correctly for 10 reps without pain or leakage for functional activities  Baseline: can lift under normal circumstances (without urgency) Goal status: MET  4.  Pt will be able to run/workout or walk for 60 or more minutes without leakage or discomfort  Baseline: haven't attempted this yet due to time, did have to jog a short distance and did not have  leakage Goal status: Partially met  5.  Pt will be able to reduce urgency and have at least 5 minutes to get to the bathroom after finishing task such as washing her hands even with full bladder. Baseline:  Goal status: INITIAL    PLAN:  PT FREQUENCY: 1x/week  PT DURATION: 12 weeks  PLANNED INTERVENTIONS: Therapeutic exercises, Therapeutic activity, Neuromuscular re-education, Balance training, Gait training, Patient/Family education, Self Care, Joint mobilization, Dry Needling, Electrical stimulation, Cryotherapy, Moist heat, Taping, Biofeedback, Manual therapy, and Re-evaluation  PLAN FOR NEXT SESSION: f/u on new exercises in HEP, coordination of pelvic floor without pushing out, f/u on urgency retraining and return to walking regularly   [employer], PT 06/13/2023, 12:04 PM

## 2023-07-31 ENCOUNTER — Ambulatory Visit: Payer: BC Managed Care – PPO | Admitting: Physical Therapy

## 2023-08-07 ENCOUNTER — Ambulatory Visit: Payer: BC Managed Care – PPO | Attending: Obstetrics and Gynecology

## 2023-08-07 DIAGNOSIS — R279 Unspecified lack of coordination: Secondary | ICD-10-CM | POA: Insufficient documentation

## 2023-08-07 DIAGNOSIS — M6281 Muscle weakness (generalized): Secondary | ICD-10-CM | POA: Insufficient documentation

## 2023-08-07 NOTE — Therapy (Signed)
OUTPATIENT PHYSICAL THERAPY FEMALE PELVIC TREATMENT   Patient Name: Michelle Church MRN: [id number] DOB:1991/07/06, 32 y.o., female Today's Date: 08/07/2023  END OF SESSION:  PT End of Session - 08/07/23 1536     Visit Number 8    Date for PT Re-Evaluation 09/05/23    Authorization Type BCBS (no longer medicaid)    PT Start Time 1533    PT Stop Time 1611    PT Time Calculation (min) 38 min    Activity Tolerance Patient tolerated treatment well    Behavior During Therapy WFL for tasks assessed/performed                    Past Medical History:  Diagnosis Date   Abnormal Pap smear of vagina    Acne    Migraines    History reviewed. No pertinent surgical history. There are no problems to display for this patient.   PCP: Jettie Pagan, NP   REFERRING PROVIDER: Jory Sims, MD   REFERRING DIAG: R32 (ICD-10-CM) - Unspecified urinary incontinence   THERAPY DIAG:  Muscle weakness (generalized)  Unspecified lack of coordination  Rationale for Evaluation and Treatment: Rehabilitation  ONSET DATE: fall of 2022  SUBJECTIVE:                                                                                                                                                                                           SUBJECTIVE STATEMENT: Pt states that she is feeling worse. She feels like she has overactive bladder and her urgency is getting worse. She feels overwhelmed with the exercises and feels like she would do better with just 2 or 3 to work on.   PAIN:  Are you having pain? No   PRECAUTIONS: None  WEIGHT BEARING RESTRICTIONS: No  FALLS:  Has patient fallen in last 6 months? No  LIVING ENVIRONMENT: Lives with: lives with their daughter Lives in: House/apartment   OCCUPATION: work from home part time  PLOF: Independent  PATIENT GOALS: stop having leakage  PERTINENT HISTORY:  Recovering from post concussion (2020) Sexual abuse:  No  BOWEL MOVEMENT: Pain with bowel movement: No No issues  URINATION: Pain with urination: No Fully empty bladder: Yes:   Stream: Strong Urgency: No Frequency: more frequent, no nocturia Leakage: Coughing, Sneezing, Laughing, Exercise, and running water Pads: Yes: 2/day  INTERCOURSE: Pain with intercourse:  no  PREGNANCY: Vaginal deliveries 1 Tearing Yes: 2nd deg   PROLAPSE: None   OBJECTIVE:  08/07/23: Patient confirms identification and approves PT to assess internal pelvic floor and treatment Yes   PELVIC MMT:   MMT 04/04/23  Vaginal 0-1/5 strength with poor coordination  (Blank rows = not tested)        TONE: low, but some perineal scar tissue restriction, no pain     COGNITION: Overall cognitive status: Within functional limits for tasks assessed   MUSCLE LENGTH: Hamstrings: normal appearance with toe touch  LUMBAR SPECIAL TESTS:  Single leg stand left trendelenburg  FUNCTIONAL TESTS:  ASLR compression improves  GAIT:  Comments: WFL   POSTURE: increased lumbar lordosis and anterior pelvic tilt  PELVIC ALIGNMENT:  LUMBARAROM/PROM:  A/PROM A/PROM  eval  Flexion   Extension   Right lateral flexion   Left lateral flexion   Right rotation   Left rotation    (Blank rows = not tested)  LOWER EXTREMITY ROM:  Passive ROM Right eval Left eval  Hip flexion    Hip extension    Hip abduction    Hip adduction    Hip internal rotation    Hip external rotation    Knee flexion    Knee extension    Ankle dorsiflexion    Ankle plantarflexion    Ankle inversion    Ankle eversion     (Blank rows = not tested)  LOWER EXTREMITY MMT:  06/13/23: 5/5 bil hips PALPATION:   General  - back tight                External Perineal Exam palpation only outside of clothing - bulging when attempting to kegel                             Internal Pelvic Floor deferred due to baby and child present  Patient confirms identification and approves PT  to assess internal pelvic floor and treatment Yes deferred   PELVIC MMT:   MMT 04/04/23  Vaginal 2/5 with difficulty relaxing and twitching with attempt to hold, 3 quick flicks slow to relax  Internal Anal Sphincter   External Anal Sphincter   Puborectalis   Diastasis Recti   (Blank rows = not tested)        TONE: high (04/29/23)   Re-assessment 06/13/23:  MMT 5/5 bil hips  TODAY'S TREATMENT:                                                                                                                              DATE:   08/07/23 Neuromuscular re-education: Pt provides verbal consent for internal vaginal/rectal pelvic floor exam. Pelvic floor muscle contraction training Quick flicks Urge drill Therapeutic activities: Vulvovaginal massage/perineal scar tissue mobilization Urge drill Clitoral stimulation - located with use of mirror during session today   06/13/23   NMRE:  Needing cues to keep from bearing down and bulging Supine - ball squeeze Ball squeeze with hip internal rotation - cues on abdomen and pelvic floor "lift up" Exercises: thoracic side bend on peanut in side sitting - breathing into ribcage, MMT as mentioned above  06/03/23    NMRE: working on posture and transversus abdominus activation with exhale during the following movements Quadruped rotation Quadruped ball squeeze and UE reach Side sit thoracic rotation and breathing Standing - rotaiton with band staggered stance and half kneel  Theract: Standing and working on staggered stance knack and quick flicks   PATIENT EDUCATION:  Education details: Access Code: [email or web address] Person educated: Patient Education method: Programmer, multimedia, Facilities manager, Actor cues, Verbal cues, and Handouts Education comprehension: verbalized understanding  HOME EXERCISE PROGRAM: Access Code: [email or web address] 2nd: [email or web address]  ASSESSMENT:  CLINICAL IMPRESSION: Pt has had a hard time and feels like urgency and leaking has  gotten worse in the last month. Internal pelvic floor muscle exam performed and there is still scar tissue restriction, but high tone is significantly improved. She is having a very difficult time finding any active pelvic floor muscle contraction, but responded to multimodal cues well. She was educated on performing regular pelvic floor muscle contraction with breathing and the urge drill to help with calming making it to the bathroom without breathing. We have simplified HEP due to feeling overwhelmed. We will see how symptoms are next session and progress accordingly.  Pt is expected to continue to make progress with skilled PT at this time and will benefit from continuing to work on endurance and coordination of core  OBJECTIVE IMPAIRMENTS: decreased coordination, difficulty walking, decreased strength, impaired tone, and postural dysfunction.   ACTIVITY LIMITATIONS: lifting, bending, continence, toileting, and caring for others  PARTICIPATION LIMITATIONS: cleaning and community activity  PERSONAL FACTORS: 1-2 comorbidities: vaginal delivery, 2nd deg tear  are also affecting patient's functional outcome.   REHAB POTENTIAL: Excellent  CLINICAL DECISION MAKING: Evolving/moderate complexity  EVALUATION COMPLEXITY: Moderate   GOALS: Goals reviewed with patient? Yes  SHORT TERM GOALS: Target date: 04/15/23 - updated 08/07/23  Ind with initial transversus abdominus activation Baseline: Goal status: MET  2.  Ind with how to exhale on exertion Baseline:  Goal status: MET  3.  Report 20% less leakage throughout the day Baseline:  Goal status: IN PROGRESS    LONG TERM GOALS: Target date: 09/05/2023  Updated 06/13/23 - updated 08/07/23 Pt will be independent with advanced HEP to maintain improvements made throughout therapy  Baseline:  Goal status: IN PROGRESS  2.  Pt will be able to functional actions such as walk to the bathroom or turn on water without leakage  Baseline: can do  these things without leakage under normal circumstances Goal status:IN PROGRESS  3.  Pt will be able to lift at least 10 lb correctly for 10 reps without pain or leakage for functional activities  Baseline: can lift under normal circumstances (without urgency) Goal status: IN PROGRESS  4.  Pt will be able to run/workout or walk for 60 or more minutes without leakage or discomfort  Baseline: haven't attempted this yet due to time, did have to jog a short distance and did not have leakage Goal status: IN PROGRESS  5.  Pt will be able to reduce urgency and have at least 5 minutes to get to the bathroom after finishing task such as washing her hands even with full bladder. Baseline:  Goal status: IN PROGRESS    PLAN:  PT FREQUENCY: 1x/week  PT DURATION: 12 weeks  PLANNED INTERVENTIONS: Therapeutic exercises, Therapeutic activity, Neuromuscular re-education, Balance training, Gait training, Patient/Family education, Self Care, Joint mobilization, Dry Needling, Electrical stimulation, Cryotherapy, Moist heat, Taping, Biofeedback, Manual therapy, and Re-evaluation  PLAN FOR NEXT SESSION: Progress pelvic floor contraction  training; begin core training with pelvic floor. How is urge drill going? Consider bladder retraining.    Julio Alm, PT, DPT09/26/244:17 PM

## 2023-08-07 NOTE — Patient Instructions (Signed)
Urge Incontinence  Ideal urination frequency is every 2-4 wakeful hours, which equates to 5-8 times within a 24-hour period.   Urge incontinence is leakage that occurs when the bladder muscle contracts, creating a sudden need to go before getting to the bathroom.   Going too often when your bladder isn't actually full can disrupt the body's automatic signals to store and hold urine longer, which will increase urgency/frequency.  In this case, the bladder "is running the show" and strategies can be learned to retrain this pattern.   One should be able to control the first urge to urinate, at around .  The bladder can hold up to a "grande latte," or . To help you gain control, practice the Urge Drill below when urgency strikes.  This drill will help retrain your bladder signals and allow you to store and hold urine longer.  The overall goal is to stretch out your time between voids to reach a more manageable voiding schedule.    Practice your "quick flicks" often throughout the day (each waking hour) even when you don't need feel the urge to go.  This will help strengthen your pelvic floor muscles, making them more effective in controlling leakage.  Urge Drill  When you feel an urge to go, follow these steps to regain control: Stop what you are doing and be still Take one deep breath, directing your air into your abdomen Think an affirming thought, such as "I've got this." Do 5 quick flicks of your pelvic floor Walk with control to the bathroom to void, or delay voiding  [facility] [address] [place], [place] [address] Phone # [phone] Fax [phone]

## 2023-08-14 ENCOUNTER — Ambulatory Visit: Payer: BC Managed Care – PPO

## 2023-08-21 ENCOUNTER — Ambulatory Visit: Payer: BC Managed Care – PPO | Attending: Obstetrics and Gynecology

## 2023-08-21 DIAGNOSIS — M6281 Muscle weakness (generalized): Secondary | ICD-10-CM | POA: Diagnosis present

## 2023-08-21 DIAGNOSIS — R279 Unspecified lack of coordination: Secondary | ICD-10-CM | POA: Insufficient documentation

## 2023-08-21 NOTE — Therapy (Signed)
OUTPATIENT PHYSICAL THERAPY FEMALE PELVIC TREATMENT   Patient Name: Michelle Church MRN: [id number] DOB:08/10/1991, 32 y.o., female Today's Date: 08/21/2023  END OF SESSION:  PT End of Session - 08/21/23 1015     Visit Number 9    Date for PT Re-Evaluation 09/05/23    Authorization Type BCBS (no longer medicaid)    PT Start Time 1015    PT Stop Time 1026    PT Time Calculation (min) 11 min    Activity Tolerance Patient tolerated treatment well    Behavior During Therapy WFL for tasks assessed/performed                     Past Medical History:  Diagnosis Date   Abnormal Pap smear of vagina    Acne    Migraines    History reviewed. No pertinent surgical history. There are no problems to display for this patient.   PCP: Jettie Pagan, NP   REFERRING PROVIDER: Jory Sims, MD   REFERRING DIAG: R32 (ICD-10-CM) - Unspecified urinary incontinence   THERAPY DIAG:  Muscle weakness (generalized)  Unspecified lack of coordination  Rationale for Evaluation and Treatment: Rehabilitation  ONSET DATE: fall of 2022  SUBJECTIVE:                                                                                                                                                                                           SUBJECTIVE STATEMENT: Pt states that she is still having urinary incontinence. She has been performing perineal massage 1x/day, but forgot about pelvic floor muscle contractions. She has been trying to work on urge drill but feels like it increases urgency.   PAIN:  Are you having pain? No   PRECAUTIONS: None  WEIGHT BEARING RESTRICTIONS: No  FALLS:  Has patient fallen in last 6 months? No  LIVING ENVIRONMENT: Lives with: lives with their daughter Lives in: House/apartment   OCCUPATION: work from home part time  PLOF: Independent  PATIENT GOALS: stop having leakage  PERTINENT HISTORY:  Recovering from post concussion  (2020) Sexual abuse: No  BOWEL MOVEMENT: Pain with bowel movement: No No issues  URINATION: Pain with urination: No Fully empty bladder: Yes:   Stream: Strong Urgency: No Frequency: more frequent, no nocturia Leakage: Coughing, Sneezing, Laughing, Exercise, and running water Pads: Yes: 2/day  INTERCOURSE: Pain with intercourse:  no  PREGNANCY: Vaginal deliveries 1 Tearing Yes: 2nd deg   PROLAPSE: None   OBJECTIVE:  08/07/23: Patient confirms identification and approves PT to assess internal pelvic floor and treatment Yes   PELVIC MMT:   MMT 04/04/23  Vaginal 0-1/5  strength with poor coordination  (Blank rows = not tested)        TONE: low, but some perineal scar tissue restriction, no pain     COGNITION: Overall cognitive status: Within functional limits for tasks assessed   MUSCLE LENGTH: Hamstrings: normal appearance with toe touch  LUMBAR SPECIAL TESTS:  Single leg stand left trendelenburg  FUNCTIONAL TESTS:  ASLR compression improves  GAIT:  Comments: WFL   POSTURE: increased lumbar lordosis and anterior pelvic tilt  PELVIC ALIGNMENT:  LUMBARAROM/PROM:  A/PROM A/PROM  eval  Flexion   Extension   Right lateral flexion   Left lateral flexion   Right rotation   Left rotation    (Blank rows = not tested)  LOWER EXTREMITY ROM:  Passive ROM Right eval Left eval  Hip flexion    Hip extension    Hip abduction    Hip adduction    Hip internal rotation    Hip external rotation    Knee flexion    Knee extension    Ankle dorsiflexion    Ankle plantarflexion    Ankle inversion    Ankle eversion     (Blank rows = not tested)  LOWER EXTREMITY MMT:  06/13/23: 5/5 bil hips PALPATION:   General  - back tight                External Perineal Exam palpation only outside of clothing - bulging when attempting to kegel                             Internal Pelvic Floor deferred due to baby and child present  Patient confirms  identification and approves PT to assess internal pelvic floor and treatment Yes deferred   PELVIC MMT:   MMT 04/04/23  Vaginal 2/5 with difficulty relaxing and twitching with attempt to hold, 3 quick flicks slow to relax  Internal Anal Sphincter   External Anal Sphincter   Puborectalis   Diastasis Recti   (Blank rows = not tested)        TONE: high (04/29/23)   Re-assessment 06/13/23:  MMT 5/5 bil hips  TODAY'S TREATMENT:                                                                                                                              DATE:   08/21/23 Neuromuscular re-education: Pelvic floor contraction training with external palpation/training working on performing with breath coordination - encouraged to perform on regular basis 3x/day  Review of urge drill and not bearing down during  08/07/23 Neuromuscular re-education: Pt provides verbal consent for internal vaginal/rectal pelvic floor exam. Pelvic floor muscle contraction training Quick flicks Urge drill Therapeutic activities: Vulvovaginal massage/perineal scar tissue mobilization Urge drill Clitoral stimulation - located with use of mirror during session today   06/13/23   NMRE:  Needing cues to keep from bearing down and bulging Supine - ball  squeeze Ball squeeze with hip internal rotation - cues on abdomen and pelvic floor "lift up" Exercises: thoracic side bend on peanut in side sitting - breathing into ribcage, MMT as mentioned above   PATIENT EDUCATION:  Education details: Access Code: [email or web address] Person educated: Patient Education method: Explanation, Demonstration, Tactile cues, Verbal cues, and Handouts Education comprehension: verbalized understanding  HOME EXERCISE PROGRAM: Access Code: [email or web address] 2nd: [email or web address]  ASSESSMENT:  CLINICAL IMPRESSION: Pt is having very difficult day due to having lost cat. She would like to leave appointment in order to go look for cat. We very quickly went  over HEP and attempted to correct pelvic floor contraction via external feedback. Believe she is getting some active contraction. She is getting good transversus abdominus contraction while attempting pelvic floor contraction with exhale. She was encouraged to continue working on urge drill with being careful to not increase pressure in abdomen pushing down on bladder. Pt is expected to continue to make progress with skilled PT at this time and will benefit from continuing to work on endurance and coordination of core  OBJECTIVE IMPAIRMENTS: decreased coordination, difficulty walking, decreased strength, impaired tone, and postural dysfunction.   ACTIVITY LIMITATIONS: lifting, bending, continence, toileting, and caring for others  PARTICIPATION LIMITATIONS: cleaning and community activity  PERSONAL FACTORS: 1-2 comorbidities: vaginal delivery, 2nd deg tear  are also affecting patient's functional outcome.   REHAB POTENTIAL: Excellent  CLINICAL DECISION MAKING: Evolving/moderate complexity  EVALUATION COMPLEXITY: Moderate   GOALS: Goals reviewed with patient? Yes  SHORT TERM GOALS: Target date: 04/15/23 - updated 08/07/23  Ind with initial transversus abdominus activation Baseline: Goal status: MET  2.  Ind with how to exhale on exertion Baseline:  Goal status: MET  3.  Report 20% less leakage throughout the day Baseline:  Goal status: IN PROGRESS    LONG TERM GOALS: Target date: 09/05/2023  Updated 06/13/23 - updated 08/07/23 Pt will be independent with advanced HEP to maintain improvements made throughout therapy  Baseline:  Goal status: IN PROGRESS  2.  Pt will be able to functional actions such as walk to the bathroom or turn on water without leakage  Baseline: can do these things without leakage under normal circumstances Goal status:IN PROGRESS  3.  Pt will be able to lift at least 10 lb correctly for 10 reps without pain or leakage for functional activities  Baseline:  can lift under normal circumstances (without urgency) Goal status: IN PROGRESS  4.  Pt will be able to run/workout or walk for 60 or more minutes without leakage or discomfort  Baseline: haven't attempted this yet due to time, did have to jog a short distance and did not have leakage Goal status: IN PROGRESS  5.  Pt will be able to reduce urgency and have at least 5 minutes to get to the bathroom after finishing task such as washing her hands even with full bladder. Baseline:  Goal status: IN PROGRESS    PLAN:  PT FREQUENCY: 1x/week  PT DURATION: 12 weeks  PLANNED INTERVENTIONS: Therapeutic exercises, Therapeutic activity, Neuromuscular re-education, Balance training, Gait training, Patient/Family education, Self Care, Joint mobilization, Dry Needling, Electrical stimulation, Cryotherapy, Moist heat, Taping, Biofeedback, Manual therapy, and Re-evaluation  PLAN FOR NEXT SESSION: Progress pelvic floor contraction training; begin core training with pelvic floor. How is urge drill going? Consider bladder retraining.    Julio Alm, PT, DPT10/08/2409:53 AM

## 2023-08-26 ENCOUNTER — Ambulatory Visit: Payer: BC Managed Care – PPO

## 2023-08-26 DIAGNOSIS — M6281 Muscle weakness (generalized): Secondary | ICD-10-CM | POA: Diagnosis not present

## 2023-08-26 DIAGNOSIS — R279 Unspecified lack of coordination: Secondary | ICD-10-CM

## 2023-08-26 NOTE — Therapy (Unsigned)
OUTPATIENT PHYSICAL THERAPY FEMALE PELVIC TREATMENT   Patient Name: Michelle Church MRN: [id number] DOB:02-Feb-1991, 32 y.o., female Today's Date: 08/26/2023  END OF SESSION:  PT End of Session - 08/26/23 1616     Visit Number 10    Date for PT Re-Evaluation 09/05/23    Authorization Type BCBS (no longer medicaid)    PT Start Time 1615    PT Stop Time 1655    PT Time Calculation (min) 40 min    Activity Tolerance Patient tolerated treatment well    Behavior During Therapy WFL for tasks assessed/performed                     Past Medical History:  Diagnosis Date   Abnormal Pap smear of vagina    Acne    Migraines    History reviewed. No pertinent surgical history. There are no problems to display for this patient.   PCP: Jettie Pagan, NP   REFERRING PROVIDER: Jory Sims, MD   REFERRING DIAG: R32 (ICD-10-CM) - Unspecified urinary incontinence   THERAPY DIAG:  Muscle weakness (generalized)  Unspecified lack of coordination  Rationale for Evaluation and Treatment: Rehabilitation  ONSET DATE: fall of 2022  SUBJECTIVE:                                                                                                                                                                                           SUBJECTIVE STATEMENT: Pt states that she has been working on pelvic floor contractions 3x/day and feels like she is feeling better about it. She feels like she can tell a difference. She does have some shaking in her legs when doing contractions and feels like it's from fatigue. She is attempting to perform urge drill, but half the time it makes her urinate. She is not having any vaginal burning and feels like it may be due to scented pads she was using.   PAIN:  Are you having pain? No   PRECAUTIONS: None  WEIGHT BEARING RESTRICTIONS: No  FALLS:  Has patient fallen in last 6 months? No  LIVING ENVIRONMENT: Lives with: lives with  their daughter Lives in: House/apartment   OCCUPATION: work from home part time  PLOF: Independent  PATIENT GOALS: stop having leakage  PERTINENT HISTORY:  Recovering from post concussion (2020) Sexual abuse: No  BOWEL MOVEMENT: Pain with bowel movement: No No issues  URINATION: Pain with urination: No Fully empty bladder: Yes:   Stream: Strong Urgency: No Frequency: more frequent, no nocturia Leakage: Coughing, Sneezing, Laughing, Exercise, and running water Pads: Yes: 2/day  INTERCOURSE: Pain with intercourse:  no  PREGNANCY: Vaginal deliveries 1 Tearing Yes: 2nd deg   PROLAPSE: None   OBJECTIVE:  08/07/23: Patient confirms identification and approves PT to assess internal pelvic floor and treatment Yes   PELVIC MMT:   MMT 04/04/23  Vaginal 0-1/5 strength with poor coordination  (Blank rows = not tested)        TONE: low, but some perineal scar tissue restriction, no pain     COGNITION: Overall cognitive status: Within functional limits for tasks assessed   MUSCLE LENGTH: Hamstrings: normal appearance with toe touch  LUMBAR SPECIAL TESTS:  Single leg stand left trendelenburg  FUNCTIONAL TESTS:  ASLR compression improves  GAIT:  Comments: WFL   POSTURE: increased lumbar lordosis and anterior pelvic tilt  PELVIC ALIGNMENT:  LUMBARAROM/PROM:  A/PROM A/PROM  eval  Flexion   Extension   Right lateral flexion   Left lateral flexion   Right rotation   Left rotation    (Blank rows = not tested)  LOWER EXTREMITY ROM:  Passive ROM Right eval Left eval  Hip flexion    Hip extension    Hip abduction    Hip adduction    Hip internal rotation    Hip external rotation    Knee flexion    Knee extension    Ankle dorsiflexion    Ankle plantarflexion    Ankle inversion    Ankle eversion     (Blank rows = not tested)  LOWER EXTREMITY MMT:  06/13/23: 5/5 bil hips PALPATION:   General  - back tight                External  Perineal Exam palpation only outside of clothing - bulging when attempting to kegel                             Internal Pelvic Floor deferred due to baby and child present  Patient confirms identification and approves PT to assess internal pelvic floor and treatment Yes deferred   PELVIC MMT:   MMT 04/04/23  Vaginal 2/5 with difficulty relaxing and twitching with attempt to hold, 3 quick flicks slow to relax  Internal Anal Sphincter   External Anal Sphincter   Puborectalis   Diastasis Recti   (Blank rows = not tested)        TONE: high (04/29/23)   Re-assessment 06/13/23:  MMT 5/5 bil hips  TODAY'S TREATMENT:                                                                                                                              DATE:   08/27/23 Manual: Perineal scar tissue massage Neuromuscular re-education: Pt provides verbal consent for internal vaginal/rectal pelvic floor exam. Pelvic floor contraction training with internal vaginal feedback Quick flicks Urge drill Therapeutic activities: Perineal anatomy Orgasm benefit and review of external stimulation - vibrator recommendations Lubricant samples given  Vaginal weight benefit - start with  using just a tampon applicator   08/21/23 Neuromuscular re-education: Pelvic floor contraction training with external palpation/training working on performing with breath coordination - encouraged to perform on regular basis 3x/day  Review of urge drill and not bearing down during  08/07/23 Neuromuscular re-education: Pt provides verbal consent for internal vaginal/rectal pelvic floor exam. Pelvic floor muscle contraction training Quick flicks Urge drill Therapeutic activities: Vulvovaginal massage/perineal scar tissue mobilization Urge drill Clitoral stimulation - located with use of mirror during session today    PATIENT EDUCATION:  Education details: Access Code: [email or web address] Person educated: Patient Education  method: Explanation, Demonstration, Tactile cues, Verbal cues, and Handouts Education comprehension: verbalized understanding  HOME EXERCISE PROGRAM: Access Code: [email or web address] 2nd: [email or web address]  ASSESSMENT:  CLINICAL IMPRESSION: {Pt doing much better this week. She is able to contract pelvic floor actively, but does demonstrate increased strength in Lt levator ani compared to Rt. Increased manual pressure to levator ani helpful in facilitating improved strength of contractions. Due to this, we discussed benefit of using a vaginal weight (tampon applicator may be plenty at this time) for feedback when performing these contractions at home. Continued to discuss benefit of orgasm to help pelvic floor muscles go through full range of motion and increase circulation. Lubricant samples given and vibrator recommendation made; pt is agreeable. Pt is expected to continue to make progress with skilled PT at this time and will benefit from continuing to work on endurance and coordination of core  OBJECTIVE IMPAIRMENTS: decreased coordination, difficulty walking, decreased strength, impaired tone, and postural dysfunction.   ACTIVITY LIMITATIONS: lifting, bending, continence, toileting, and caring for others  PARTICIPATION LIMITATIONS: cleaning and community activity  PERSONAL FACTORS: 1-2 comorbidities: vaginal delivery, 2nd deg tear  are also affecting patient's functional outcome.   REHAB POTENTIAL: Excellent  CLINICAL DECISION MAKING: Evolving/moderate complexity  EVALUATION COMPLEXITY: Moderate   GOALS: Goals reviewed with patient? Yes  SHORT TERM GOALS: Target date: 04/15/23 - updated 08/07/23  Ind with initial transversus abdominus activation Baseline: Goal status: MET  2.  Ind with how to exhale on exertion Baseline:  Goal status: MET  3.  Report 20% less leakage throughout the day Baseline:  Goal status: IN PROGRESS    LONG TERM GOALS: Target date: 09/05/2023  Updated 06/13/23 -  updated 08/07/23 Pt will be independent with advanced HEP to maintain improvements made throughout therapy  Baseline:  Goal status: IN PROGRESS  2.  Pt will be able to functional actions such as walk to the bathroom or turn on water without leakage  Baseline: can do these things without leakage under normal circumstances Goal status:IN PROGRESS  3.  Pt will be able to lift at least 10 lb correctly for 10 reps without pain or leakage for functional activities  Baseline: can lift under normal circumstances (without urgency) Goal status: IN PROGRESS  4.  Pt will be able to run/workout or walk for 60 or more minutes without leakage or discomfort  Baseline: haven't attempted this yet due to time, did have to jog a short distance and did not have leakage Goal status: IN PROGRESS  5.  Pt will be able to reduce urgency and have at least 5 minutes to get to the bathroom after finishing task such as washing her hands even with full bladder. Baseline:  Goal status: IN PROGRESS    PLAN:  PT FREQUENCY: 1x/week  PT DURATION: 12 weeks  PLANNED INTERVENTIONS: Therapeutic exercises, Therapeutic activity, Neuromuscular re-education, Balance training, Gait training, Patient/Family education, Self Care, Joint mobilization,  Dry Needling, Electrical stimulation, Cryotherapy, Moist heat, Taping, Biofeedback, Manual therapy, and Re-evaluation  PLAN FOR NEXT SESSION: Progress pelvic floor contraction training; begin core training with pelvic floor. How is urge drill going? Consider bladder retraining.    Julio Alm, PT, DPT10/15/244:16 PM

## 2023-09-09 ENCOUNTER — Ambulatory Visit: Payer: BC Managed Care – PPO

## 2023-11-11 ENCOUNTER — Ambulatory Visit: Payer: BC Managed Care – PPO | Attending: Obstetrics and Gynecology

## 2023-11-11 DIAGNOSIS — R279 Unspecified lack of coordination: Secondary | ICD-10-CM | POA: Insufficient documentation

## 2023-11-11 DIAGNOSIS — M6281 Muscle weakness (generalized): Secondary | ICD-10-CM | POA: Insufficient documentation

## 2023-11-11 NOTE — Patient Instructions (Signed)
Bladder retraining:  Drink 4-8oz an hour ONLY WATER Stop water intake 3 hours before bed Try to go at least 2-3 hours between trips to the bathroom - go whether you need to or not.  For two weeks.     Urge Incontinence  Ideal urination frequency is every 2-4 wakeful hours, which equates to 5-8 times within a 24-hour period.   Urge incontinence is leakage that occurs when the bladder muscle contracts, creating a sudden need to go before getting to the bathroom.   Going too often when your bladder isn't actually full can disrupt the body's automatic signals to store and hold urine longer, which will increase urgency/frequency.  In this case, the bladder "is running the show" and strategies can be learned to retrain this pattern.   One should be able to control the first urge to urinate, at around .  The bladder can hold up to a "grande latte," or . To help you gain control, practice the Urge Drill below when urgency strikes.  This drill will help retrain your bladder signals and allow you to store and hold urine longer.  The overall goal is to stretch out your time between voids to reach a more manageable voiding schedule.    Practice your "quick flicks" often throughout the day (each waking hour) even when you don't need feel the urge to go.  This will help strengthen your pelvic floor muscles, making them more effective in controlling leakage.  Urge Drill  When you feel an urge to go, follow these steps to regain control: Stop what you are doing and be still Take one deep breath, directing your air into your abdomen Think an affirming thought, such as "I've got this." Do 5 quick flicks of your pelvic floor Walk with control to the bathroom to void, or delay voiding     [facility] [address] [place], [place] [address] Phone # [phone] Fax [phone]

## 2023-11-11 NOTE — Therapy (Addendum)
 OUTPATIENT PHYSICAL THERAPY FEMALE PELVIC TREATMENT   Patient Name: Michelle Church MRN: [id number] DOB:08-10-1991, 32 y.o., female Today's Date: 11/11/2023  END OF SESSION:  PT End of Session - 11/11/23 1020     Visit Number 11    Date for PT Re-Evaluation 09/05/23    Authorization Type BCBS (no longer medicaid)    PT Start Time 1016    PT Stop Time 1056    PT Time Calculation (min) 40 min    Activity Tolerance Patient tolerated treatment well    Behavior During Therapy WFL for tasks assessed/performed                     Past Medical History:  Diagnosis Date   Abnormal Pap smear of vagina    Acne    Migraines    History reviewed. No pertinent surgical history. There are no active problems to display for this patient.   PCP: Arby Lyle LABOR, NP   REFERRING PROVIDER: Lacinda Delon DASEN, MD   REFERRING DIAG: R32 (ICD-10-CM) - Unspecified urinary incontinence   THERAPY DIAG:  Muscle weakness (generalized)  Unspecified lack of coordination  Rationale for Evaluation and Treatment: Rehabilitation  ONSET DATE: fall of 2022  SUBJECTIVE:                                                                                                                                                                                           SUBJECTIVE STATEMENT: Pt states that she had urethral bulking procedure for stress urinary incontinence which has been very helpful. However, she is still having urgency with urination and bowel movements.   PAIN:  Are you having pain? No   PRECAUTIONS: None  WEIGHT BEARING RESTRICTIONS: No  FALLS:  Has patient fallen in last 6 months? No  LIVING ENVIRONMENT: Lives with: lives with their daughter Lives in: House/apartment   OCCUPATION: work from home part time  PLOF: Independent  PATIENT GOALS: stop having leakage  PERTINENT HISTORY:  Recovering from post concussion (2020) Sexual abuse: No  BOWEL MOVEMENT: Pain  with bowel movement: No No issues  URINATION: Pain with urination: No Fully empty bladder: Yes:   Stream: Strong Urgency: No Frequency: more frequent, no nocturia Leakage: Coughing, Sneezing, Laughing, Exercise, and running water Pads: Yes: 2/day  INTERCOURSE: Pain with intercourse: no  PREGNANCY: Vaginal deliveries 1 Tearing Yes: 2nd deg   PROLAPSE: None   OBJECTIVE:  11/11/23 Patient confirms identification and approves PT to assess internal pelvic floor and treatment Yes   PELVIC MMT:   MMT 04/04/23  Vaginal 1/5, 2 second endurance, poor coordination with frequent bearing down during  contraction  (Blank rows = not tested)        TONE: low  08/07/23: Patient confirms identification and approves PT to assess internal pelvic floor and treatment Yes   PELVIC MMT:   MMT 04/04/23  Vaginal 0-1/5 strength with poor coordination  (Blank rows = not tested)        TONE: low, but some perineal scar tissue restriction, no pain     COGNITION: Overall cognitive status: Within functional limits for tasks assessed   MUSCLE LENGTH: Hamstrings: normal appearance with toe touch  LUMBAR SPECIAL TESTS:  Single leg stand left trendelenburg  FUNCTIONAL TESTS:  ASLR compression improves  GAIT:  Comments: WFL   POSTURE: increased lumbar lordosis and anterior pelvic tilt   LOWER EXTREMITY MMT:  06/13/23: 5/5 bil hips PALPATION:   General  - back tight                External Perineal Exam palpation only outside of clothing - bulging when attempting to kegel                             Internal Pelvic Floor deferred due to baby and child present  Patient confirms identification and approves PT to assess internal pelvic floor and treatment Yes deferred   PELVIC MMT:   MMT 04/04/23  Vaginal 2/5 with difficulty relaxing and twitching with attempt to hold, 3 quick flicks slow to relax  (Blank rows = not tested)        TONE: high (04/29/23)   Re-assessment  06/13/23:  MMT 5/5 bil hips  TODAY'S TREATMENT:                                                                                                                              DATE:   11/11/23 RE-EVAL Neuromuscular re-education: Pt provides verbal consent for internal vaginal/rectal pelvic floor exam. Internal vaginal re-assessment of pelvic floor muscle contraction Pelvic floor contraction training with internal vaginal feedback Quick flicks Long holds Urge drill Therapeutic activities: Bladder irritants Strict bladder retraining Urge drill review   08/27/23 Manual: Perineal scar tissue massage Neuromuscular re-education: Pt provides verbal consent for internal vaginal/rectal pelvic floor exam. Pelvic floor contraction training with internal vaginal feedback Quick flicks Urge drill Therapeutic activities: Perineal anatomy Orgasm benefit and review of external stimulation - vibrator recommendations Lubricant samples given  Vaginal weight benefit - start with using just a tampon applicator   08/21/23 Neuromuscular re-education: Pelvic floor contraction training with external palpation/training working on performing with breath coordination - encouraged to perform on regular basis 3x/day  Review of urge drill and not bearing down during   PATIENT EDUCATION:  Education details: Access Code: [email or web address] Person educated: Patient Education method: Explanation, Demonstration, Tactile cues, Verbal cues, and Handouts Education comprehension: verbalized understanding  HOME EXERCISE PROGRAM: Access Code: [email or web address] 2nd: [email or web address]  ASSESSMENT:  CLINICAL IMPRESSION: Pt is overall doing better after  urethral bulking procedure, but still reports leaking 1-2x/day with urgency and has been having fecal urgency. Pelvic floor muscle exam continues to demosntrate significant weakness of pelvic floor muscles with poor coordination; she was iniitally bearing down when attempting contraction  of the pelvic floor, which would increase leaking when she is walking to the bathroom attempting urge drill. However, even with weakness diffiuclty with coordination, she is able to more easily find active contraction consistently, where has gotten to a point where she could not do this at our last few sessions. Believe that urethral bulking, practicing contractions up until procedure, and reduced stress with improvements in incontinence have allowed her to form stronger connection with pelvic floor muscles and perform more consistent and coordinated contraction. Du eto this, we progressed pelvic floor muscle strengthening program to include long holds in addition ot quick flicks. She was encouraged to perform 3x/day. We reviewed the urge drill and she was agreeable to start strict bladder retraining program to work on urgency. Believe she will continue to make good progress if she continues pelvic floor muscle and core strengthening program; if she does not continue this strengthening, she will likely return to having more urinary incontinence with not just urgency. Pt is expected to continue to make progress with skilled PT at this time and will benefit from continuing to work on endurance and coordination of core  OBJECTIVE IMPAIRMENTS: decreased coordination, difficulty walking, decreased strength, impaired tone, and postural dysfunction.   ACTIVITY LIMITATIONS: lifting, bending, continence, toileting, and caring for others  PARTICIPATION LIMITATIONS: cleaning and community activity  PERSONAL FACTORS: 1-2 comorbidities: vaginal delivery, 2nd deg tear are also affecting patient's functional outcome.   REHAB POTENTIAL: Excellent  CLINICAL DECISION MAKING: Evolving/moderate complexity  EVALUATION COMPLEXITY: Moderate   GOALS: Goals reviewed with patient? Yes  SHORT TERM GOALS: Target date: 04/15/23 - updated 08/07/23 - updated 11/11/23  Ind with initial transversus abdominus  activation Baseline: Goal status: MET  2.  Ind with how to exhale on exertion Baseline:  Goal status: MET  3.  Report 20% less leakage throughout the day Baseline:  Goal status: MET 11/11/23    LONG TERM GOALS: Target date: 09/05/2023  Updated 06/13/23 - updated 08/07/23 - updated 11/11/23 Pt will be independent with advanced HEP to maintain improvements made throughout therapy  Baseline:  Goal status: IN PROGRESS 11/11/23  2.  Pt will be able to functional actions such as walk to the bathroom or turn on water without leakage  Baseline: Pt having a lot of urinary and fecal urgency still and leaks 1-2x/day Goal status:IN PROGRESS 11/11/23  3.  Pt will be able to lift at least 10 lb correctly for 10 reps without pain or leakage for functional activities  Baseline: can lift under normal circumstances (without urgency) Goal status: IN PROGRESS 11/11/23  4.  Pt will be able to run/workout or walk for 60 or more minutes without leakage or discomfort  Baseline: haven't attempted this yet due to time, did have to jog a short distance and did not have leakage Goal status: IN PROGRESS  5.  Pt will be able to reduce urgency and have at least 5 minutes to get to the bathroom after finishing task such as washing her hands even with full bladder. Baseline:  Goal status: IN PROGRESS 11/11/23    PLAN:  PT FREQUENCY: 1x/week  PT DURATION: 12 weeks  PLANNED INTERVENTIONS: Therapeutic exercises, Therapeutic activity, Neuromuscular re-education, Balance training, Gait training, Patient/Family education, Self Care, Joint mobilization, Dry  Needling, Electrical stimulation, Cryotherapy, Moist heat, Taping, Biofeedback, Manual therapy, and Re-evaluation  PLAN FOR NEXT SESSION: Progress pelvic floor contraction training; begin core training with pelvic floor. How is urge drill going? Consider bladder retraining.    Josette Mares, PT, DPT12/31/2410:23 AM  PHYSICAL THERAPY DISCHARGE  SUMMARY  Visits from Start of Care: 11  Current functional level related to goals / functional outcomes: Unknown   Remaining deficits: See above   Education / Equipment: HEP   Patient agrees to discharge. Patient goals were partially met. Patient is being discharged due to not returning since the last visit.  Josette Mares, PT, DPT08/02/2509:39 AM

## 2024-02-10 ENCOUNTER — Ambulatory Visit: Payer: BC Managed Care – PPO
